# Patient Record
Sex: Male | Born: 1970 | Race: White | Hispanic: No | Marital: Married | Smoking: Former smoker
Health system: Southern US, Community
[De-identification: ages and names within clinical notes are randomized; demographics above are authoritative.]

## PROBLEM LIST (undated history)

## (undated) DIAGNOSIS — B009 Herpesviral infection, unspecified: Secondary | ICD-10-CM

## (undated) DIAGNOSIS — G5600 Carpal tunnel syndrome, unspecified upper limb: Secondary | ICD-10-CM

## (undated) DIAGNOSIS — T7840XA Allergy, unspecified, initial encounter: Secondary | ICD-10-CM

## (undated) HISTORY — DX: Carpal tunnel syndrome, unspecified upper limb: G56.00

## (undated) HISTORY — DX: Herpesviral infection, unspecified: B00.9

## (undated) HISTORY — DX: Allergy, unspecified, initial encounter: T78.40XA

## (undated) HISTORY — PX: WISDOM TOOTH EXTRACTION: SHX21

---

## 2015-11-05 ENCOUNTER — Encounter: Payer: Self-pay | Admitting: Family Medicine

## 2015-11-05 ENCOUNTER — Ambulatory Visit (INDEPENDENT_AMBULATORY_CARE_PROVIDER_SITE_OTHER): Payer: Managed Care, Other (non HMO) | Admitting: Family Medicine

## 2015-11-05 VITALS — BP 126/85 | HR 58 | Temp 98.3°F | Resp 16 | Ht 73.0 in | Wt 150.8 lb

## 2015-11-05 DIAGNOSIS — A6000 Herpesviral infection of urogenital system, unspecified: Secondary | ICD-10-CM | POA: Insufficient documentation

## 2015-11-05 DIAGNOSIS — G5601 Carpal tunnel syndrome, right upper limb: Secondary | ICD-10-CM | POA: Diagnosis not present

## 2015-11-05 DIAGNOSIS — Z87891 Personal history of nicotine dependence: Secondary | ICD-10-CM | POA: Diagnosis not present

## 2015-11-05 DIAGNOSIS — R200 Anesthesia of skin: Secondary | ICD-10-CM | POA: Insufficient documentation

## 2015-11-05 DIAGNOSIS — A6009 Herpesviral infection of other urogenital tract: Secondary | ICD-10-CM

## 2015-11-05 DIAGNOSIS — Z7689 Persons encountering health services in other specified circumstances: Secondary | ICD-10-CM | POA: Diagnosis not present

## 2015-11-05 DIAGNOSIS — Z23 Encounter for immunization: Secondary | ICD-10-CM

## 2015-11-05 DIAGNOSIS — Z8659 Personal history of other mental and behavioral disorders: Secondary | ICD-10-CM | POA: Diagnosis not present

## 2015-11-05 DIAGNOSIS — J3089 Other allergic rhinitis: Secondary | ICD-10-CM

## 2015-11-05 DIAGNOSIS — G5621 Lesion of ulnar nerve, right upper limb: Secondary | ICD-10-CM

## 2015-11-05 DIAGNOSIS — G5600 Carpal tunnel syndrome, unspecified upper limb: Secondary | ICD-10-CM | POA: Insufficient documentation

## 2015-11-05 MED ORDER — NAPROXEN 500 MG PO TABS: 500.0000 mg | ORAL_TABLET | Freq: Two times a day (BID) | ORAL | 1 refills | Status: DC

## 2015-11-05 MED ORDER — VALACYCLOVIR HCL 500 MG PO TABS: 500.0000 mg | ORAL_TABLET | Freq: Two times a day (BID) | ORAL | 0 refills | Status: DC | PRN

## 2015-11-05 NOTE — Assessment & Plan Note (Signed)
Stable, controlled on OTC Loratadine

## 2015-11-05 NOTE — Assessment & Plan Note (Addendum)
Remains smoke free >2 years now, less craving with reduced stress at new job - Encouraged remain smoke free - Doesn't meet future criteria for lung CA screening < 30 pack year smoker

## 2015-11-05 NOTE — Progress Notes (Signed)
Subjective:    Patient ID: Andre Robbins, male    DOB: [birth year], 45 y.o.   MRN: [id number]  Andre Robbins is a 45 y.o. male presenting on 11/05/2015 for Establish Care (main concerned carpal tunnel)  Previously PCP in [place], moved to [facility] in June 2017 for job with [employer]  HPI   RIGHT UPPER EXTREMITY NUMBNESS / PARESTHESIAS: - Reports last 4 years with gradual worsening R-sided forearm numbness and paresthesia pain, with mild involvement of 4th and 5th digits in R hand only. He suspected this was due to Carpal Tunnel with overuse due to variety of factors including work. No significant prior injury, work-up or treatment. He managed symptoms on his own with wrist splint/brace, activity modification, as needed NSAIDs with ibuprofen. - Recent injury 2 weeks ago while playing on monkey bars with his daughter, he was hanging by his right arm and seems to have strained his muscles and aggravated his nerve, causing worsening numbness, tingling, paresthesias in his R forearm, with stiffness in R elbow - Gradual improvement, treated with Prednisone taper at urgent care about 1.5 weeks ago, he has modified work, now using mouse in left hand, using stamp, but still signs many documents daily - Previously did not have hand or grip weakness, or finger numbness/tingling, usually flare up with 1-2 days, resolved with brace and ibuprofen / naproxen - Requests referral for further evaluation  Seasonal Allergies - Reports chronic mild seasonal allergies, when living in [place], controlled with Loratadine 10mg  daily during seasons as needed  H/o Depression - Previously worked as Insurance account manager at [employer], and he was not happy with that job and his family life, limited time for family, this year he has switched jobs, now moved to [facility]. Prior treatments included therapy but no medical treatment. - No concerns today - Fam history of depression both parents  Genital Herpes: - Reports chronic problem for many  years, significantly improved over years with fewer outbreaks - Previously had taken Valtrex with as needed, has been off for while, never on suppression therapy - Request rx for Valtrex as needed flares, 1-2 x yearly  Lifestyle: - No regular diet - Regular exercise currently with cycling (was going to work on bicycle about 1 mile daily), 1-2x weekly longer rides up to 10 miles  [employer] - Prior history of smoking 25 years, < 1ppd, tapered down over last several years to quit, treated with Wellbutrin with good results, also improved recently with less craving now with career change and less stress  Health Maintenance: - No family history of prostate CA. Has had prior DRE reportedly slightly enlarged prostate within past 5 years, no prior PSA.  - Family history of Colon CA (PGF age 38), will wait until age 25 for screening, interested in cologuard   Past Medical History:  Diagnosis Date  . Allergy   . Carpal tunnel syndrome   . Depression    in past  . Herpes    genital   Social History   Social History  . Marital status: Married    Spouse name: N/A  . Number of children: 1  . Years of education: PhD (Biology)   Occupational History  . [employer] - Production designer, theatre/television/film    Social History Main Topics  . Smoking status: Former Smoker    Packs/day: 0.50    Years: 25.00    Types: Cigarettes    Quit date: 10/06/2013  . Smokeless tobacco: Former Neurosurgeon  . Alcohol use 3.6 oz/week  6 Glasses of wine per week  . Drug use: No  . Sexual activity: Not on file   Other Topics Concern  . Not on file   Social History Narrative  . No narrative on file   Family History  Problem Relation Age of Onset  . Kidney nephrosis Mother   . Depression Mother   . Depression Father   . Colon cancer Paternal Grandfather 54   No current outpatient prescriptions on file prior to visit.   No current facility-administered medications on file prior to visit.     Review of Systems Per  HPI unless specifically indicated above     Objective:    BP 126/85   Pulse (!) 58   Temp 98.3 F (36.8 C) (Oral)   Resp 16   Ht 6\' 1"  (1.854 m)   Wt 150 lb 12.8 oz (68.4 kg)   BMI 19.90 kg/m   Wt Readings from Last 3 Encounters:  11/05/15 150 lb 12.8 oz (68.4 kg)    Physical Exam  Constitutional: He is oriented to person, place, and time. He appears well-developed and well-nourished. No distress.  Well-appearing, comfortable, cooperative  HENT:  Head: Normocephalic and atraumatic.  Mouth/Throat: Oropharynx is clear and moist.  Neck: Normal range of motion. Neck supple. No thyromegaly present.  Cardiovascular: Normal rate, regular rhythm, normal heart sounds and intact distal pulses.   No murmur heard. Pulmonary/Chest: Effort normal and breath sounds normal. No respiratory distress. He has no wheezes. He has no rales.  Musculoskeletal: He exhibits no edema or tenderness.  Right Upper Extremity Inspection: Normal appearance, has wrist brace, removed for exam Palpation: See below mild reduced sensation R dorsal forearm and 5th digit. Non-tender over R elbow or wrist extensors, wrist, and hand / digits ROM: Significant limited ROM with R elbow extension, prefers to stay flexed, can gradually actively extend fully. Special Testing: Tinel's over ulnar nerve in elbow mild positive, R median nerve carpal tunnel tinel's minimal positive with only some sensation into R middle finger without significant paresthesias Strength: 5/5 grip bilaterally, wrist flex/ext   Lymphadenopathy:    He has no cervical adenopathy.  Neurological: He is alert and oriented to person, place, and time. He has normal reflexes. Coordination normal.  Reduced sensation to light touch Right dorsal forearm closer to wrist, very mild reduced sensation in R 5th finger mostly dorsal.  Skin: Skin is warm and dry. No rash noted. He is not diaphoretic.  Psychiatric: He has a normal mood and affect. His behavior is  normal.  Nursing note and vitals reviewed.   No results found for this or any previous visit.    Assessment & Plan:   Problem List Items Addressed This Visit    History of depression    Resolved. Secondary to life stressors with prior job and limited family time, not related to all aspects of his life. - Treated with counseling only, no medical therapy - PHQ2: 0 today  Plan: 1. 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 Referral to [facility] for peripheral nerve evaluation and Nerve Conduction Study (NCS), follow-up results and recs and anticipate may need surgical referral given chronicity of problem 2. Rx Naproxen 500mg  BID x 2-4 weeks then PRN, prior improved on steroid, now continue NSAID 3. Continue conservative therapy with wrist splint and activity modification, also recommend forearm strap      Relevant Medications   naproxen (NAPROSYN) 500 MG tablet   Other Relevant Orders   Ambulatory referral to Neurology   Carpal tunnel syndrome    Possible R carpal tunnel  syndrome in past, however currently seems less likely to be affecting median nerve, given location of nerve symptoms - See Ulnar Nerve entrapment A&P - Referral to [facility] for eval and NCS, f/u      Relevant Medications   naproxen (NAPROSYN) 500 MG tablet   Other Relevant Orders   Ambulatory referral to Neurology    Other Visit Diagnoses    Encounter to establish care with new doctor    -  Primary   Need for Tdap vaccination       Relevant Orders   Tdap vaccine greater than or equal to 7yo IM (Completed)      Meds ordered this encounter  Medications  . naproxen (NAPROSYN) 500 MG tablet    Sig: Take 1 tablet (500 mg total) by mouth 2 (two) times daily with a meal. For 2 to 4 weeks then as needed.    Dispense:  60 tablet    Refill:  1  . valACYclovir (VALTREX) 500 MG tablet    Sig: Take 1 tablet (500 mg total) by mouth 2 (two) times daily as needed. For 3 days per HSV flare as needed.    Dispense:  30 tablet    Refill:  0      Follow up plan: Return in about 2 months (around 01/05/2016) for Annual physical.  Saralyn Pilar, DO [facility] 11/05/2015, 10:25 PM

## 2015-11-05 NOTE — Assessment & Plan Note (Addendum)
Suspected ulnar nerve entrapment as likely etiology for most or some of R UE nerve symptoms, with paresthesias and localized dorsal forearm numbness, likely overuse injury with some component of R epicondylitis in past - Recent improved on prednisone - Not entirely consistent with only carpal tunnel syndrome  Plan: 1. Referral [facility] for peripheral nerve evaluation and Nerve Conduction Study (NCS), follow-up results and recs and anticipate may need surgical referral given chronicity of problem 2. Rx Naproxen 500mg  BID x 2-4 weeks then PRN, prior improved on steroid, now continue NSAID 3. Continue conservative therapy with wrist splint and activity modification, also recommend forearm strap

## 2015-11-05 NOTE — Patient Instructions (Signed)
Thank you for coming in to clinic today.  1. For your Right Arm, I think you have a elbow or forearm Ulnar Nerve Entrapment, possibly no carpal tunnel, however this should all be evaluated by a Nerve Conduction Study, this will be arranged by your specialist, anticipate Orthopedics, will notify you with further information and an appointment as soon as we can. This may take a few days to weeks. - For now Recommend trial of Anti-inflammatory with Naproxen (Naprosyn) 500mg  tabs - take one with food and plenty of water TWICE daily every day (breakfast and dinner), for next 2 to 4 weeks, then you may take only as needed - DO NOT TAKE any ibuprofen, aleve, motrin while you are taking this medicine - It is safe to take Tylenol Ext Str 500mg  tabs - take 1 to 2 (max dose 1000mg ) every 6 hours as needed for breakthrough pain, max 24 hour daily dose is 6 to 8 tablets or 4000mg   Keep wearing wrist split as well if this is helping  Can try one of the forearm straps for tennis elbow, these are available OTC as well. And may provide some relief, probably better in future to prevent worsening, less likely to help acutely now.  Take valtrex 1 pill twice a day for 3 days at first signs of a flare, max duration of 5 days if needed.  TDap today, good for 10 years  Next visit we will plan on Annual Physical in December 2018, we will await outside lab records, and order labs at that visit  Please schedule a follow-up appointment with Dr. Althea CharonKaramalegos in 2 months December 2018 for Annual Physical and Labs  If you have any other questions or concerns, please feel free to call the clinic or send a message through MyChart. You may also schedule an earlier appointment if necessary.  Saralyn PilarAlexander Karamalegos, DO [facility]

## 2015-11-05 NOTE — Assessment & Plan Note (Signed)
Possible R carpal tunnel syndrome in past, however currently seems less likely to be affecting median nerve, given location of nerve symptoms - See Ulnar Nerve entrapment A&P - [facility] for eval and NCS, f/u

## 2015-11-05 NOTE — Assessment & Plan Note (Signed)
Stable, chronic 1-2 flares yearly Start rx Valtrex 500mg  BID PRN x 3 days with flares, rx given, follow-up progress, may consider suppressive therapy in future if needed

## 2015-11-05 NOTE — Assessment & Plan Note (Signed)
Resolved. Secondary to life stressors with prior job and limited family time, not related to all aspects of his life. - Treated with counseling only, no medical therapy - PHQ2: 0 today  Plan: 1. Routine follow-up as needed

## 2016-07-04 LAB — HEMOGLOBIN A1C: HEMOGLOBIN A1C: 5.5

## 2016-07-04 LAB — LIPID PANEL
Cholesterol: 195 (ref 0–200)
HDL: 78 — AB (ref 35–70)
LDL CALC: 96
Triglycerides: 103 (ref 40–160)

## 2016-07-04 LAB — BASIC METABOLIC PANEL
Creatinine: 1.2 (ref 0.6–1.3)
Glucose: 82

## 2016-11-19 ENCOUNTER — Ambulatory Visit (INDEPENDENT_AMBULATORY_CARE_PROVIDER_SITE_OTHER): Payer: Managed Care, Other (non HMO) | Admitting: Family Medicine

## 2016-11-19 ENCOUNTER — Encounter: Payer: Self-pay | Admitting: Family Medicine

## 2016-11-19 VITALS — BP 116/69 | HR 62 | Temp 98.1°F | Resp 16 | Ht 73.0 in | Wt 152.0 lb

## 2016-11-19 DIAGNOSIS — A6009 Herpesviral infection of other urogenital tract: Secondary | ICD-10-CM | POA: Diagnosis not present

## 2016-11-19 DIAGNOSIS — R202 Paresthesia of skin: Secondary | ICD-10-CM | POA: Diagnosis not present

## 2016-11-19 DIAGNOSIS — J3089 Other allergic rhinitis: Secondary | ICD-10-CM

## 2016-11-19 DIAGNOSIS — Z114 Encounter for screening for human immunodeficiency virus [HIV]: Secondary | ICD-10-CM | POA: Diagnosis not present

## 2016-11-19 DIAGNOSIS — R200 Anesthesia of skin: Secondary | ICD-10-CM

## 2016-11-19 DIAGNOSIS — Z0000 Encounter for general adult medical examination without abnormal findings: Secondary | ICD-10-CM

## 2016-11-19 MED ORDER — VALACYCLOVIR HCL 500 MG PO TABS: 500.0000 mg | ORAL_TABLET | Freq: Two times a day (BID) | ORAL | 0 refills | Status: DC | PRN

## 2016-11-19 NOTE — Patient Instructions (Addendum)
Thank you for coming to the clinic today.  1.  Keep up the good work!  I agree about nerve, expect slow gradual improvement.  No new treatments needed   3.  DUE for FASTING BLOOD WORK (no food or drink after midnight before the lab appointment, only water or coffee without cream/sugar on the morning of)  [facility] for lab draw in 1 WEEK approximately  For Lab Results, once available within 2-3 days of blood draw, you can can log in to MyChart online to view your results and a brief explanation. Also, we can discuss results at next follow-up visit.  Please schedule a Follow-up Appointment to: Return in about 1 year (around 11/19/2017) for Annual Physical.  If you have any other questions or concerns, please feel free to call the clinic or send a message through MyChart. You may also schedule an earlier appointment if necessary.  Additionally, you may be receiving a survey about your experience at our clinic within a few days to 1 week by e-mail or mail. We value your feedback.  Saralyn PilarAlexander Karamalegos, DO [facility]

## 2016-11-19 NOTE — Progress Notes (Addendum)
Subjective:    Patient ID: Andre Robbins, male    DOB: 31-Oct-1970, 46 y.o.   MRN: [id number]  Andre Robbins is a 46 y.o. male presenting on 11/19/2016 for Annual Exam  HPI   Here for Annual Physical Exam and due for Lab Orders. - He is a Paramedic, and he had biometric labs done in Summer 2018. Unsure results, states they were "all normal", did not return here for lab orders prior to visit today  Lifestyle: - Weight stable +/- 1-2 lbs in year - Tries to eat healthy and balanced diet - Exercise with biking  FOLLOW-UP R-Forearm/UE NUMBNESS - Last visit with me 11/05/15, for initial visit for same chronic problem with gradual worse, R hand only, thought Carpal Tunnel , \treated with referral to [facility] for nerve conduction test and eval, see prior notes for background information. - Interval update with saw Dr Dorene Grebe on 11/8 for NCS results were within range of normal. Follow-up on 11/29 without clear etiology, ultimately dx with nerve compression injury, gradual improvement, no further testing or labs required, considered other imaging spine/brain declined. Considered Orthopedics referral. - Today patient reports he has done well in past year, some gradual improvement with the numbness, but still present, seems to be smaller area and less severe, it is not affecting his daily function. He has switched to Left handed mouse work and writing with R hand. New job able to do this. He is also wearing a brace for carpal tunnel to avoid strain on R hand/wrist. - Takes Advil rarely PRN - Denies weakness, pain, swelling, redness  Seasonal/Environmental Allergies -Continues on Loratadine  History of Genital Herpes - Reports 1-3 x flares a year, took Valtrex PRN with good results, request refill did not finish last bottle  Right Knee Pain, intermittent - Reports old prior R knee injury, age 63s, occasional problem now with bike riding >20 miles or long strenuous activity, only gets  mild flare and ache, without swelling or other concerns. He has never been dx with arthritis. No knee x-rays. Rare advil PRN  Health Maintenance: - Due for routine HIV screening - UTD Flu Shot 11/03/16  Depression screen PHQ 2/9 11/19/2016 11/05/2015  Decreased Interest 0 0  Down, Depressed, Hopeless 0 0  PHQ - 2 Score 0 0    Past Medical History:  Diagnosis Date  . Allergy   . Carpal tunnel syndrome   . Depression    in past  . Herpes    genital   History reviewed. No pertinent surgical history. Social History   Socioeconomic History  . Marital status: Married    Spouse name: Not on file  . Number of children: 1  . Years of education: PhD (Biology)  . Highest education level: Not on file  Social Needs  . Financial resource strain: Not on file  . Food insecurity - worry: Not on file  . Food insecurity - inability: Not on file  . Transportation needs - medical: Not on file  . Transportation needs - non-medical: Not on file  Occupational History  . Occupation: Chief Operating Officer - Production designer, theatre/television/film  Tobacco Use  . Smoking status: Former Smoker    Packs/day: 0.50    Years: 25.00    Pack years: 12.50    Types: Cigarettes    Last attempt to quit: 10/06/2013    Years since quitting: 3.1  . Smokeless tobacco: Former Engineer, water and Sexual Activity  . Alcohol use: Yes    Alcohol/week:  3.6 oz    Types: 6 Glasses of wine per week  . Drug use: No  . Sexual activity: Not on file  Other Topics Concern  . Not on file  Social History Narrative  . Not on file   Family History  Problem Relation Age of Onset  . Kidney nephrosis Mother   . Depression Mother   . Depression Father   . Colon cancer Paternal Grandfather 67  . Prostate cancer Neg Hx    No current outpatient medications on file prior to visit.   No current facility-administered medications on file prior to visit.     Review of Systems  Constitutional: Negative for activity change, appetite change, chills,  diaphoresis, fatigue, fever and unexpected weight change.  HENT: Negative for congestion, hearing loss and sinus pressure.   Eyes: Negative for visual disturbance.  Respiratory: Negative for apnea, cough, choking, chest tightness, shortness of breath and wheezing.   Cardiovascular: Negative for chest pain, palpitations and leg swelling.  Gastrointestinal: Negative for abdominal pain, anal bleeding, blood in stool, constipation, diarrhea, nausea and vomiting.  Endocrine: Negative for cold intolerance and polyuria.  Genitourinary: Negative for difficulty urinating, dysuria, frequency and hematuria.  Musculoskeletal: Negative for arthralgias and neck pain.  Skin: Negative for rash.  Allergic/Immunologic: Positive for environmental allergies.  Neurological: Positive for numbness (Right forearm dorsal only, chronic gradual improve). Negative for dizziness, tremors, weakness, light-headedness and headaches.  Hematological: Negative for adenopathy.  Psychiatric/Behavioral: Negative for behavioral problems, dysphoric mood and sleep disturbance. The patient is not nervous/anxious.    Per HPI unless specifically indicated above     Objective:    BP 116/69   Pulse 62   Temp 98.1 F (36.7 C) (Oral)   Resp 16   Ht 6\' 1"  (1.854 m)   Wt 152 lb (68.9 kg)   BMI 20.05 kg/m   Wt Readings from Last 3 Encounters:  11/19/16 152 lb (68.9 kg)  11/05/15 150 lb 12.8 oz (68.4 kg)    Physical Exam  Constitutional: He is oriented to person, place, and time. He appears well-developed and well-nourished. No distress.  Well-appearing, comfortable, cooperative  HENT:  Head: Normocephalic and atraumatic.  Mouth/Throat: Oropharynx is clear and moist.  Nares patent without purulence or edema. Bilateral TMs clear without erythema, effusion or bulging. Oropharynx clear without erythema, exudates, edema or asymmetry.  Eyes: Conjunctivae and EOM are normal. Pupils are equal, round, and reactive to light. Right eye  exhibits no discharge. Left eye exhibits no discharge.  Neck: Normal range of motion. Neck supple. No thyromegaly present.  No carotid bruits  Cardiovascular: Normal rate, regular rhythm, normal heart sounds and intact distal pulses.  No murmur heard. Pulmonary/Chest: Effort normal and breath sounds normal. No respiratory distress. He has no wheezes. He has no rales.  Good air movement  Abdominal: Soft. Bowel sounds are normal. He exhibits no distension and no mass. There is no tenderness.  Musculoskeletal: Normal range of motion. He exhibits no edema or tenderness.  Upper / Lower Extremities: - Normal muscle tone, strength bilateral upper extremities 5/5, lower extremities 5/5  Bilateral knees, normal appear, non bulky, no crepitus, full AROM, non tender  Right Hand/Wrist Inspection: Normal appearance, symmetrical, no bulky MCP joints, no edema or erythema. Palpation: Non tender hand / wrist, carpal bones, including MCP, base of thumb. No distinct anatomical snuff box or scaphoid tenderness.  ROM: full active wrist ROM flex / ext, ulnar / radial deviation Strength: 5/5 grip, thumb opposition, wrist flex/ext Neurovascular:  distally intact  Lymphadenopathy:       Head (right side): No submandibular adenopathy present.       Head (left side): No submandibular adenopathy present.    He has no cervical adenopathy.    He has no axillary adenopathy.       Right: No supraclavicular adenopathy present.       Left: No supraclavicular adenopathy present.  Neurological: He is alert and oriented to person, place, and time.  Distal sensation intact to light touch all extremities - except R forearm dorsal not affecting wrist or proximal forearm/elbow. Mildly reduced sensation to light touch, improved from previous 1 yr  Skin: Skin is warm and dry. No rash noted. He is not diaphoretic. No erythema.  Left forearm dorsal 1 x 1 cm firm but mobile round mass non tender, no surrounding abnormality, no  erythema  Psychiatric: He has a normal mood and affect. His behavior is normal.  Well groomed, good eye contact, normal speech and thoughts  Nursing note and vitals reviewed.      No results found for this or any previous visit.    Assessment & Plan:   Problem List Items Addressed This Visit    Environmental and seasonal allergies    Stable controlled Continue 2nd gen anti-histamine      Genital herpes    Stable, chronic 1-3 flares yearly Refill Valtrex 500mg  BID PRN x 3 days with flares, rx given, follow-up progress, may consider suppressive therapy in future if needed      Relevant Medications   valACYclovir (VALTREX) 500 MG tablet   Right arm numbness    Gradually improving R localized dorsal forearm numbness Chronic >4 years S/p NCS 11/2015 and [facility] consult - no clear dx, normal NCS, thought nerve compression, no further treatment/work-up  Plan: 1. Recommended continue avoid overuse R arm, continue splint, keep using L hand mouse 2. Follow-up if worsening - reconsider Carpal Tunnel etiology - meds, ortho, inj       Other Visit Diagnoses    Annual physical exam    -  Primary   Due for annual labs, placed [facility] orders, given to patient, if reassuring, check yearly, reviewed future colon/prostate CA screening guidelines   Relevant Orders   Comprehensive metabolic panel   Lipid panel   Hemoglobin A1c   CBC with Differential/Platelet   Screening for HIV (human immunodeficiency virus)       Relevant Orders   HIV antibody      Meds ordered this encounter  Medications  . valACYclovir (VALTREX) 500 MG tablet    Sig: Take 1 tablet (500 mg total) 2 (two) times daily as needed by mouth. For 3 days per HSV flare as needed.    Dispense:  30 tablet    Refill:  0    Follow up plan: Return in about 1 year (around 11/19/2017) for Annual Physical.  Printed [facility] orders given today. Review results 1 week. Next year notify for [facility] order release few weeks  before.  Saralyn Pilar, DO [facility] 11/19/2016, 8:53 PM

## 2016-11-19 NOTE — Assessment & Plan Note (Signed)
Gradually improving R localized dorsal forearm numbness Chronic >4 years S/p NCS 11/[facility] - no clear dx, normal NCS, thought nerve compression, no further treatment/work-up  Plan: 1. Recommended continue avoid overuse R arm, continue splint, keep using L hand mouse 2. Follow-up if worsening - reconsider Carpal Tunnel etiology - meds, ortho, inj

## 2016-11-19 NOTE — Assessment & Plan Note (Signed)
Stable controlled Continue 2nd gen anti-histamine

## 2016-11-19 NOTE — Assessment & Plan Note (Signed)
Stable, chronic 1-3 flares yearly Refill Valtrex 500mg  BID PRN x 3 days with flares, rx given, follow-up progress, may consider suppressive therapy in future if needed

## 2016-11-27 ENCOUNTER — Other Ambulatory Visit: Payer: Self-pay | Admitting: Family Medicine

## 2016-11-27 DIAGNOSIS — R7989 Other specified abnormal findings of blood chemistry: Secondary | ICD-10-CM

## 2016-11-27 DIAGNOSIS — R7309 Other abnormal glucose: Secondary | ICD-10-CM

## 2016-11-27 DIAGNOSIS — Z0000 Encounter for general adult medical examination without abnormal findings: Secondary | ICD-10-CM

## 2016-11-27 LAB — COMPREHENSIVE METABOLIC PANEL
A/G RATIO: 2.3 — AB (ref 1.2–2.2)
ALT: 7 IU/L (ref 0–44)
AST: 15 IU/L (ref 0–40)
Albumin: 4.4 g/dL (ref 3.5–5.5)
Alkaline Phosphatase: 70 IU/L (ref 39–117)
BILIRUBIN TOTAL: 0.8 mg/dL (ref 0.0–1.2)
BUN / CREAT RATIO: 11 (ref 9–20)
BUN: 13 mg/dL (ref 6–24)
CHLORIDE: 102 mmol/L (ref 96–106)
CO2: 25 mmol/L (ref 20–29)
Calcium: 9.4 mg/dL (ref 8.7–10.2)
Creatinine, Ser: 1.19 mg/dL (ref 0.76–1.27)
GFR calc Af Amer: 84 mL/min/{1.73_m2} (ref >59)
GFR calc non Af Amer: 73 mL/min/{1.73_m2} (ref >59)
GLOBULIN, TOTAL: 1.9 g/dL (ref 1.5–4.5)
Glucose: 94 mg/dL (ref 65–99)
Potassium: 4.6 mmol/L (ref 3.5–5.2)
SODIUM: 140 mmol/L (ref 134–144)
Total Protein: 6.3 g/dL (ref 6.0–8.5)

## 2016-11-27 LAB — CBC WITH DIFFERENTIAL/PLATELET
BASOS ABS: 0 10*3/uL (ref 0.0–0.2)
BASOS: 1 %
EOS (ABSOLUTE): 0.1 10*3/uL (ref 0.0–0.4)
EOS: 2 %
HEMATOCRIT: 44.5 % (ref 37.5–51.0)
HEMOGLOBIN: 15.5 g/dL (ref 13.0–17.7)
Immature Grans (Abs): 0 10*3/uL (ref 0.0–0.1)
Immature Granulocytes: 0 %
LYMPHS ABS: 2.3 10*3/uL (ref 0.7–3.1)
Lymphs: 42 %
MCH: 32.3 pg (ref 26.6–33.0)
MCHC: 34.8 g/dL (ref 31.5–35.7)
MCV: 93 fL (ref 79–97)
MONOCYTES: 9 %
Monocytes Absolute: 0.5 10*3/uL (ref 0.1–0.9)
NEUTROS ABS: 2.5 10*3/uL (ref 1.4–7.0)
Neutrophils: 46 %
Platelets: 211 10*3/uL (ref 150–379)
RBC: 4.8 x10E6/uL (ref 4.14–5.80)
RDW: 12.9 % (ref 12.3–15.4)
WBC: 5.4 10*3/uL (ref 3.4–10.8)

## 2016-11-27 LAB — LIPID PANEL
CHOL/HDL RATIO: 2.8 ratio (ref 0.0–5.0)
CHOLESTEROL TOTAL: 198 mg/dL (ref 100–199)
HDL: 70 mg/dL (ref >39)
LDL CALC: 113 mg/dL — AB (ref 0–99)
TRIGLYCERIDES: 77 mg/dL (ref 0–149)
VLDL CHOLESTEROL CAL: 15 mg/dL (ref 5–40)

## 2016-11-27 LAB — HIV ANTIBODY (ROUTINE TESTING W REFLEX): HIV Screen 4th Generation wRfx: NONREACTIVE

## 2016-11-27 LAB — HEMOGLOBIN A1C
Est. average glucose Bld gHb Est-mCnc: 114 mg/dL
Hgb A1c MFr Bld: 5.6 % (ref 4.8–5.6)

## 2016-11-29 ENCOUNTER — Encounter: Payer: Self-pay | Admitting: Family Medicine

## 2017-01-10 ENCOUNTER — Emergency Department
Admission: EM | Admit: 2017-01-10 | Discharge: 2017-01-10 | Disposition: A | Discharge status: Home / Self Care | Payer: Managed Care, Other (non HMO) | Attending: Student in an Organized Health Care Education/Training Program | Admitting: Student in an Organized Health Care Education/Training Program

## 2017-01-10 ENCOUNTER — Emergency Department: Payer: Managed Care, Other (non HMO)

## 2017-01-10 DIAGNOSIS — Y92017 Garden or yard in single-family (private) house as the place of occurrence of the external cause: Secondary | ICD-10-CM | POA: Diagnosis not present

## 2017-01-10 DIAGNOSIS — Z87891 Personal history of nicotine dependence: Secondary | ICD-10-CM | POA: Diagnosis not present

## 2017-01-10 DIAGNOSIS — S91331A Puncture wound without foreign body, right foot, initial encounter: Secondary | ICD-10-CM | POA: Diagnosis not present

## 2017-01-10 DIAGNOSIS — W268XXA Contact with other sharp object(s), not elsewhere classified, initial encounter: Secondary | ICD-10-CM | POA: Diagnosis not present

## 2017-01-10 DIAGNOSIS — Y9389 Activity, other specified: Secondary | ICD-10-CM | POA: Diagnosis not present

## 2017-01-10 DIAGNOSIS — F329 Major depressive disorder, single episode, unspecified: Secondary | ICD-10-CM | POA: Insufficient documentation

## 2017-01-10 DIAGNOSIS — Y998 Other external cause status: Secondary | ICD-10-CM | POA: Diagnosis not present

## 2017-01-10 DIAGNOSIS — S99921A Unspecified injury of right foot, initial encounter: Secondary | ICD-10-CM | POA: Diagnosis present

## 2017-01-10 LAB — BASIC METABOLIC PANEL
Anion gap: 8 (ref 5–15)
BUN: 19 mg/dL (ref 6–20)
CALCIUM: 8.9 mg/dL (ref 8.9–10.3)
CO2: 25 mmol/L (ref 22–32)
CREATININE: 1.18 mg/dL (ref 0.61–1.24)
Chloride: 107 mmol/L (ref 101–111)
GFR calc non Af Amer: >60 mL/min (ref >60)
Glucose, Bld: 92 mg/dL (ref 65–99)
Potassium: 3.6 mmol/L (ref 3.5–5.1)
SODIUM: 140 mmol/L (ref 135–145)

## 2017-01-10 LAB — CBC WITH DIFFERENTIAL/PLATELET
BASOS PCT: 1 %
Basophils Absolute: 0 10*3/uL (ref 0–0.1)
EOS ABS: 0 10*3/uL (ref 0–0.7)
EOS PCT: 1 %
HCT: 45.2 % (ref 40.0–52.0)
HEMOGLOBIN: 15.4 g/dL (ref 13.0–18.0)
Lymphocytes Relative: 20 %
Lymphs Abs: 2 10*3/uL (ref 1.0–3.6)
MCH: 31.1 pg (ref 26.0–34.0)
MCHC: 34.1 g/dL (ref 32.0–36.0)
MCV: 91.4 fL (ref 80.0–100.0)
Monocytes Absolute: 0.8 10*3/uL (ref 0.2–1.0)
Monocytes Relative: 8 %
NEUTROS PCT: 70 %
Neutro Abs: 7 10*3/uL — ABNORMAL HIGH (ref 1.4–6.5)
PLATELETS: 218 10*3/uL (ref 150–440)
RBC: 4.94 MIL/uL (ref 4.40–5.90)
RDW: 12.8 % (ref 11.5–14.5)
WBC: 9.9 10*3/uL (ref 3.8–10.6)

## 2017-01-10 MED ORDER — LEVOFLOXACIN 750 MG PO TABS: 750.0000 mg | ORAL_TABLET | Freq: Every day | ORAL | 0 refills | Status: AC

## 2017-01-10 MED ORDER — PIPERACILLIN-TAZOBACTAM 3.375 G IVPB 30 MIN
3.3750 g | Freq: Once | INTRAVENOUS | Status: AC
  Administered 2017-01-10: 3.375 g via INTRAVENOUS
  Filled 2017-01-10: qty 50

## 2017-01-10 MED ORDER — BACITRACIN ZINC 500 UNIT/GM EX OINT: TOPICAL_OINTMENT | Freq: Once | CUTANEOUS | Status: DC

## 2017-01-10 NOTE — Discharge Instructions (Addendum)
You are being treated for a plantar foot puncture wound. This type of wound is a serious injury that is prone to infection. Your labs and x-ray are normal at this time. Take the antibiotic as directed. Monitor the wound closely for signs of worsening infection. Use daily epsom salt soaks for wound care. Follow-up with your provider or return to the ED for signs of worsening infection.

## 2017-01-10 NOTE — ED Provider Notes (Signed)
[facility] Emergency Department Provider Note ____________________________________________  Time seen: 2055  I have reviewed the triage vital signs and the nursing notes.  HISTORY  Chief Complaint  Puncture Wound   HPI Andre Robbins is a 47 y.o. male presents to the ED accompanied by his family, for evaluation of pain to the plantar right foot.  Patient describes he was at home walking outside, when he excellently stepped on an exposed nail stuck in a piece of plywood.  He reports the nail piercing to the bottom of his work boot, but immediately lifting up his foot at the time of the prick.  He denies any active or spontaneous bleeding.  He continued to work following the accident for about 6 hours.  He then began to experience some increasing pain to the plantar surface of the foot.  He also describes an episode of noting low-grade fevers with his temporal thermometer.  He reports several readings ranging from 99-101 F.  He he reports some chills as well as some malaise.  He is unsure if those symptoms are related to his earlier puncture wound.  He presents now with pain, redness, tenderness to the plantar surface of foot as well as some vague redness noted to the dorsal aspect of the foot as well.  Past Medical History:  Diagnosis Date  . Allergy   . Carpal tunnel syndrome   . Depression    in past  . Herpes    genital    Patient Active Problem List   Diagnosis Date Noted  . Genital herpes 11/05/2015  . Carpal tunnel syndrome 11/05/2015  . Right arm numbness 11/05/2015  . Environmental and seasonal allergies 11/05/2015  . Former smoker 11/05/2015    History reviewed. No pertinent surgical history.  Prior to Admission medications   Medication Sig Start Date End Date Taking? Authorizing Provider  levofloxacin (LEVAQUIN) 750 MG tablet Take 1 tablet (750 mg total) by mouth daily for 7 days. 01/10/17 01/17/17  Edmon Magid, Charlesetta IvoryJenise V Bacon, PA-C  valACYclovir  (VALTREX) 500 MG tablet Take 1 tablet (500 mg total) 2 (two) times daily as needed by mouth. For 3 days per HSV flare as needed. 11/19/16   Smitty CordsKaramalegos, Alexander J, DO    Allergies Patient has no known allergies.  Family History  Problem Relation Age of Onset  . Kidney nephrosis Mother   . Depression Mother   . Depression Father   . Colon cancer Paternal Grandfather [id number]  . Prostate cancer Neg Hx     Social History Social History   Tobacco Use  . Smoking status: Former Smoker    Packs/day: 0.50    Years: 25.00    Pack years: 12.50    Types: Cigarettes    Last attempt to quit: 10/06/2013    Years since quitting: 3.2  . Smokeless tobacco: Former Engineer, waterUser  Substance Use Topics  . Alcohol use: Yes    Alcohol/week: 3.6 oz    Types: 6 Glasses of wine per week  . Drug use: No    Review of Systems  Constitutional: Positive for fever. Cardiovascular: Negative for chest pain. Respiratory: Negative for shortness of breath. Gastrointestinal: Negative for abdominal pain, vomiting and diarrhea. Genitourinary: Negative for dysuria. Musculoskeletal: Negative for back pain. Skin: Negative for rash.  Right foot plantar wound is noted. Neurological: Negative for headaches, focal weakness or numbness. ____________________________________________  PHYSICAL EXAM:  VITAL SIGNS: ED Triage Vitals  Enc Vitals Group     BP 01/10/17 1939 118/75  Pulse Rate 01/10/17 1939 87     Resp 01/10/17 1939 17     Temp 01/10/17 1939 98.8 F (37.1 C)     Temp Source 01/10/17 1939 Oral     SpO2 01/10/17 1939 97 %     Weight 01/10/17 1937 150 lb (68 kg)     Height 01/10/17 1937 6\' 1"  (1.854 m)     Head Circumference --      Peak Flow --      Pain Score 01/10/17 1937 6     Pain Loc --      Pain Edu? --      Excl. in GC? --     Constitutional: Alert and oriented. Well appearing and in no distress. Cardiovascular: Normal rate, regular rhythm. Normal distal pulses. Respiratory: Normal respiratory  effort. No wheezes/rales/rhonchi. Musculoskeletal: Nontender with normal range of motion in all extremities.  Neurologic:  Normal gait without ataxia. Normal speech and language. No gross focal neurologic deficits are appreciated. Skin:  Skin is warm, dry and intact. No rash noted.  Right foot with a small superficial plantar wound overlying the fourth digit MTP.  There is some local erythema noted no purulence or pustule appreciated.  There is also some corresponding erythema to the dorsal aspect of the foot.  No streaking or lymphangitis is appreciated at this time.  Patient is tender to palpation to the dorsal aspect of the foot over the fourth MTP and interspace. ____________________________________________   LABS (pertinent positives/negatives)  Labs Reviewed  CBC WITH DIFFERENTIAL/PLATELET - Abnormal; Notable for the following components:      Result Value   Neutro Abs 7.0 (*)    All other components within normal limits  BASIC METABOLIC PANEL  ___________________________________________   RADIOLOGY  Right Foot  IMPRESSION: No acute osseous abnormality. ____________________________________________  PROCEDURES  Procedures Zosyn 3.375 mg IVP ____________________________________________  INITIAL IMPRESSION / ASSESSMENT AND PLAN / ED COURSE  Patient with ED evaluation of a right foot plantar wound with local cellulitis.  Patient's exam is consistent with a superficial wound to the plantar surface of the foot.  His HPI was somewhat concerning given his sudden onset of fevers and malaise.  As such labs were drawn.  Baseline labs are reassuring at this time showing no acute leukocytosis.  Patient is given an IV dose of piperacillin-tazobactam in the ED.  He is discharged with a prescription for levofloxacin to dose daily for the next 7 days.  He will continue to monitor the wound for any signs of worsening or progressing infection.  Return precautions have been reviewed with the  patient's family.  Verbalized understanding will be discharged with wound care instructions. ____________________________________________  FINAL CLINICAL IMPRESSION(S) / ED DIAGNOSES  Final diagnoses:  Puncture wound of right foot, initial encounter      Lissa Hoard, PA-C 01/10/17 2308    Willy Eddy, MD 01/10/17 2316

## 2017-01-10 NOTE — ED Triage Notes (Signed)
Patient reports stepping on a nail right foot this afternoon.

## 2017-01-10 NOTE — ED Notes (Signed)
Pt says he stepped on a nail earlier today and has a puncture wound on the bottom of his right foot; thought he would be fine but now the area feels hot and his foot is throbbing

## 2017-01-10 NOTE — ED Notes (Signed)
Patient reports stepped on a nail earlier today and having pain and swelling to site.  Tetanus utd.  Area noted to sole of right foot red with swelling noted.

## 2017-01-13 ENCOUNTER — Inpatient Hospital Stay: Payer: Managed Care, Other (non HMO) | Admitting: Family Medicine

## 2017-01-14 ENCOUNTER — Encounter: Payer: Self-pay | Admitting: Family Medicine

## 2017-01-14 ENCOUNTER — Ambulatory Visit (INDEPENDENT_AMBULATORY_CARE_PROVIDER_SITE_OTHER): Payer: Managed Care, Other (non HMO) | Admitting: Family Medicine

## 2017-01-14 VITALS — BP 114/72 | HR 60 | Temp 98.2°F | Resp 16 | Ht 73.0 in | Wt 151.0 lb

## 2017-01-14 DIAGNOSIS — B351 Tinea unguium: Secondary | ICD-10-CM | POA: Diagnosis not present

## 2017-01-14 DIAGNOSIS — S91331A Puncture wound without foreign body, right foot, initial encounter: Secondary | ICD-10-CM | POA: Diagnosis not present

## 2017-01-14 MED ORDER — TERBINAFINE HCL 250 MG PO TABS: 250.0000 mg | ORAL_TABLET | Freq: Every day | ORAL | 1 refills | Status: DC

## 2017-01-14 NOTE — Progress Notes (Signed)
Subjective:    Patient ID: Andre Robbins, male    DOB: May 01, 1970, 47 y.o.   MRN: [id number]  Ommar Nottage is a 47 y.o. male presenting on 01/14/2017 for Hospitalization Follow-up (rightside foot pain improved stepped on nail)   HPI  ED FOLLOW-UP VISIT  Hospital/Location: [facility] Date of ED Visit: 01/10/17  Reason for Presenting to ED: Puncture Wound, R Foot  FOLLOW-UP - ED provider note and record have been reviewed - Patient presents today about 4 days after recent ED visit. Brief summary of recent course, patient had accidental puncture wound to R foot when stepped on nail stuck in piece of wood, pierced bottom of his work boot, did not have obvious bleeding or other injury, his TDap was up to date 2017, he kept working on it all day for several hours, no problem, then later that evening he developed significant pain and possible swelling, he felt "chilled" and possibly low grade fever, went to ED, testing in ED with X-ray R foot negative, labs with chemistry and CBC w/o leukocytosis, given IV Zosyn x 1 dose, then discharged on rx Levaquin 750mg  daily x 7 days. - Today reports overall has done well after discharge from ED. Symptoms of pain have dramatically improved, seems only some soreness if sitting still and resting, at times, more activity on his foot seemed to do better, now mostly pain free except if putting direct pressure on it - Mild swelling resolved. No redness or warmth, never drained pus - Denies any more fevers chills or rash, nausea vomiting diarrhea - New medications on discharge: Levaquin 750mg  daily x 7 days, last dose (01/16/17) - Changes to current meds on discharge: None  Additional complaint  Onychomycosis, R Foot 3rd toenail - He has prior history of toenail fungal infection, seems only localized one toenail, has been given rx in past only took for partial course, some improve but did not resolve. He would like to try again after finishes current antibiotics. Denies  redness swelling toenail pain   Depression screen [facility] 2/9 11/19/2016 11/05/2015  Decreased Interest 0 0  Down, Depressed, Hopeless 0 0  PHQ - 2 Score 0 0    Social History   Tobacco Use  . Smoking status: Former Smoker    Packs/day: 0.50    Years: 25.00    Pack years: 12.50    Types: Cigarettes    Last attempt to quit: 10/06/2013    Years since quitting: 3.2  . Smokeless tobacco: Former Engineer, water Use Topics  . Alcohol use: Yes    Alcohol/week: 3.6 oz    Types: 6 Glasses of wine per week  . Drug use: No    Review of Systems Per HPI unless specifically indicated above     Objective:    BP 114/72   Pulse 60   Temp 98.2 F (36.8 C) (Oral)   Resp 16   Ht 6\' 1"  (1.854 m)   Wt 151 lb (68.5 kg)   BMI 19.92 kg/m   Wt Readings from Last 3 Encounters:  01/14/17 151 lb (68.5 kg)  01/10/17 150 lb (68 kg)  11/19/16 152 lb (68.9 kg)    Physical Exam  Constitutional: He is oriented to person, place, and time. He appears well-developed and well-nourished. No distress.  Well-appearing, comfortable, cooperative  HENT:  Head: Normocephalic and atraumatic.  Mouth/Throat: Oropharynx is clear and moist.  Eyes: Conjunctivae are normal. Right eye exhibits no discharge. Left eye exhibits no discharge.  Neck: Normal range  of motion. Neck supple.  Cardiovascular: Normal rate and intact distal pulses.  No murmur heard. Pulmonary/Chest: Effort normal.  Musculoskeletal: He exhibits no edema.  Neurological: He is alert and oriented to person, place, and time.  Skin: Skin is warm and dry. No rash noted. He is not diaphoretic. No erythema.  Right foot plantar aspect with small puncture wound that is superficially healed over. No open wound. No ulceration. No erythema, only very minimal tender at that one point, no surrounding tender or abnormality, see picture  R foot 3rd toenail with thickening appearance consistent onychomycosis, other toenails not involved, no erythema, no edema    Nursing note and vitals reviewed.    Right Foot     Results for orders placed or performed during the hospital encounter of 01/10/17  Basic metabolic panel  Result Value Ref Range   Sodium 140 135 - 145 mmol/L   Potassium 3.6 3.5 - 5.1 mmol/L   Chloride 107 101 - 111 mmol/L   CO2 25 22 - 32 mmol/L   Glucose, Bld 92 65 - 99 mg/dL   BUN 19 6 - 20 mg/dL   Creatinine, Ser 7.82 0.61 - 1.24 mg/dL   Calcium 8.9 8.9 - 95.6 mg/dL   GFR calc non Af Amer >60 >60 mL/min   GFR calc Af Amer >60 >60 mL/min   Anion gap 8 5 - 15  CBC with Differential  Result Value Ref Range   WBC 9.9 3.8 - 10.6 K/uL   RBC 4.94 4.40 - 5.90 MIL/uL   Hemoglobin 15.4 13.0 - 18.0 g/dL   HCT 21.3 08.6 - 57.8 %   MCV 91.4 80.0 - 100.0 fL   MCH 31.1 26.0 - 34.0 pg   MCHC 34.1 32.0 - 36.0 g/dL   RDW 46.9 62.9 - 52.8 %   Platelets 218 150 - 440 K/uL   Neutrophils Relative % 70 %   Neutro Abs 7.0 (H) 1.4 - 6.5 K/uL   Lymphocytes Relative 20 %   Lymphs Abs 2.0 1.0 - 3.6 K/uL   Monocytes Relative 8 %   Monocytes Absolute 0.8 0.2 - 1.0 K/uL   Eosinophils Relative 1 %   Eosinophils Absolute 0.0 0 - 0.7 K/uL   Basophils Relative 1 %   Basophils Absolute 0.0 0 - 0.1 K/uL      Assessment & Plan:   Problem List Items Addressed This Visit    Onychomycosis of toenail Start Terbinafine 250mg  daily x 4 weeks, has existing 2 weeks rx at home, for total 6 weeks, advised if dramatically improved can stop, otherwise refill given for 4 more weeks may need max 12    Relevant Medications   terbinafine (LAMISIL) 250 MG tablet    Other Visit Diagnoses    Puncture wound of right foot, initial encounter    -  Primary Resolving puncture wound / possible local cellulitis. Now no sign of secondary infection UTD TDap 10/2015 S/p Zosyn IV x 1 dose 01/10/17 Advised to finish Levaquin 750mg  daily, last dose 01/16/17 Return criteria given       Meds ordered this encounter  Medications  . terbinafine (LAMISIL) 250 MG tablet     Sig: Take 1 tablet (250 mg total) by mouth daily. For 4 weeks, may take up to 6 weeks to see if improved    Dispense:  30 tablet    Refill:  1   I have reviewed the discharge medication list, and have reconciled the current and discharge medications today.  Follow up plan: Return if symptoms worsen or fail to improve, for follow-up as scheduled in 11/2017.  Saralyn Pilar, DO [facility] 01/14/2017, 2:32 PM

## 2017-01-14 NOTE — Patient Instructions (Addendum)
Thank you for coming to the office today.   1. Foot puncture wound looks good, no further care needed. FInish Levaquin antibiotic  2. For Right toenail fungal infection - onychomycosis - start Terbinafine 250mg  daily for 6 weeks (gave you bottle of 30 for 4 weeks, use your existing, and added 1 refill if needed),  Max is 12 weeks  Please schedule a Follow-up Appointment to: Return if symptoms worsen or fail to improve, for follow-up as scheduled in 11/2017.  If you have any other questions or concerns, please feel free to call the office or send a message through MyChart. You may also schedule an earlier appointment if necessary.  Additionally, you may be receiving a survey about your experience at our office within a few days to 1 week by e-mail or mail. We value your feedback.  Saralyn PilarAlexander Kristyne Woodring, DO [facility]

## 2017-07-01 LAB — LIPID PANEL
Cholesterol: 185 (ref 0–200)
HDL: 73 — AB (ref 35–70)
LDL Cholesterol: 99
Triglycerides: 63 (ref 40–160)

## 2017-07-01 LAB — MEASLES/MUMPS/RUBELLA IMMUNITY
RUBEOLA AB, IGG: 82.6
Rubella Antibodies, IGG: 3.26

## 2017-07-01 LAB — BASIC METABOLIC PANEL
CREATININE: 1.1 (ref 0.6–1.3)
GLUCOSE: 123

## 2017-07-01 LAB — HEMOGLOBIN A1C: Hgb A1c MFr Bld: 5.4 (ref 4.0–6.0)

## 2017-08-03 ENCOUNTER — Ambulatory Visit: Payer: Managed Care, Other (non HMO) | Admitting: Family Medicine

## 2017-08-03 ENCOUNTER — Ambulatory Visit
Admission: RE | Admit: 2017-08-03 | Discharge: 2017-08-03 | Disposition: A | Discharge status: Home / Self Care | Payer: Managed Care, Other (non HMO) | Source: Ambulatory Visit | Attending: Family Medicine | Admitting: Family Medicine

## 2017-08-03 ENCOUNTER — Encounter: Payer: Self-pay | Admitting: Family Medicine

## 2017-08-03 VITALS — BP 117/68 | HR 62 | Temp 98.4°F | Resp 16 | Ht 73.0 in | Wt 150.0 lb

## 2017-08-03 DIAGNOSIS — S91332A Puncture wound without foreign body, left foot, initial encounter: Secondary | ICD-10-CM

## 2017-08-03 DIAGNOSIS — X58XXXA Exposure to other specified factors, initial encounter: Secondary | ICD-10-CM | POA: Diagnosis not present

## 2017-08-03 MED ORDER — MUPIROCIN 2 % EX OINT: 1.0000 "application " | TOPICAL_OINTMENT | Freq: Two times a day (BID) | CUTANEOUS | 0 refills | Status: DC

## 2017-08-03 NOTE — Progress Notes (Signed)
Subjective:    Patient ID: Andre Robbins, male    DOB: 1970-12-11, 47 y.o.   MRN: [id number]  Andre Robbins is a 47 y.o. male presenting on 08/03/2017 for Foot Pain (Left foot )   HPI   Urgent Care FOLLOW-UP VISIT  Hospital/Location: [facility] Date of UC Visit: 07/18/17  Reason for Presenting to UC: Left Foot Injury, Splinter Removal  FOLLOW-UP Left Foot Pain / Splinter Injury - Patient presents today about 16 days after recent UC visit. 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 He was unable to remove it, next day some inc swelling and pain still retained. - He was seen at [facility]gent [facility] on 07/18/17, they treated him with small incision to remove splinter and they removed 1/2 inch splinter by report after local anesthesia and rinsed with lactated ringers, he has picture of it. Treated with Keflex 500mg  TID for 10 days, completed this course without significant change in course. - Now describes persistent symptoms with pain in Left foot, worse in morning with pain while walking on it, still with redness and swelling. Some improve with more activity on it. Overall he feels symptoms just not resolving. But does not seem to be worsening. - He took advil PRN with some relief. He is able to function and stand at work, but now bothered by constant symptoms  - New medications on discharge: Tramadol (has not taken this yet, concern with opioid), Keflex (finished course 10 days) - Changes to current meds on discharge: none  Admits some occasional twinges of pain some brief tingling Admits some residual swelling and local redness Denies  fevers chills other joint pain or swelling  Depression screen [facility] 2/9 08/03/2017 11/19/2016 11/05/2015  Decreased Interest 0 0 0  Down, Depressed, Hopeless 0 0 0  PHQ - 2 Score 0 0 0    Social History   Tobacco Use  . Smoking status: Former Smoker    Packs/day: 0.50    Years: 25.00    Pack years: 12.50    Types: Cigarettes    Last attempt to quit: 10/06/2013    Years since quitting: 3.8  . Smokeless tobacco: Former Engineer, water Use Topics  . Alcohol use: Yes    Alcohol/week: 3.6 oz    Types: 6 Glasses of wine per week  . Drug use: No    Review of Systems Per HPI unless specifically indicated above     Objective:    BP 117/68   Pulse 62   Temp 98.4 F (36.9 C) (Oral)   Resp 16   Ht 6\' 1"  (1.854 m)   Wt 150 lb (68 kg)   BMI 19.79 kg/m   Wt Readings from Last 3 Encounters:  08/03/17 150 lb (68 kg)  01/14/17 151 lb (68.5 kg)  01/10/17 150 lb (68 kg)    Physical Exam  Constitutional: He is oriented to person, place, and time. He appears well-developed and well-nourished. No distress.  Well-appearing, comfortable, cooperative  HENT:  Head: Normocephalic and atraumatic.  Mouth/Throat: Oropharynx is clear and moist.  Eyes: Conjunctivae are normal. Right eye exhibits no discharge. Left eye exhibits  no discharge.  Cardiovascular: Normal rate.  Pulmonary/Chest: Effort normal.  Musculoskeletal: He exhibits no edema.  Neurological: He is alert and oriented to person, place, and time.  Skin: Skin is warm and dry. No rash noted. He is not diaphoretic. No erythema.  Localized superficial dorsal foot puncture wound - see picture. Non tender, no palpable foreign body on exam, no drainage, no extending erythema  Psychiatric: He has a normal mood and affect. His behavior is normal.  Well groomed, good eye contact, normal speech and thoughts  Nursing note and vitals reviewed.    Left foot     Results for orders placed or performed during the hospital encounter of  01/10/17  Basic metabolic panel  Result Value Ref Range   Sodium 140 135 - 145 mmol/L   Potassium 3.6 3.5 - 5.1 mmol/L   Chloride 107 101 - 111 mmol/L   CO2 25 22 - 32 mmol/L   Glucose, Bld 92 65 - 99 mg/dL   BUN 19 6 - 20 mg/dL   Creatinine, Ser 7.82 0.61 - 1.24 mg/dL   Calcium 8.9 8.9 - 95.6 mg/dL   GFR calc non Af Amer >60 >60 mL/min   GFR calc Af Amer >60 >60 mL/min   Anion gap 8 5 - 15  CBC with Differential  Result Value Ref Range   WBC 9.9 3.8 - 10.6 K/uL   RBC 4.94 4.40 - 5.90 MIL/uL   Hemoglobin 15.4 13.0 - 18.0 g/dL   HCT 21.3 08.6 - 57.8 %   MCV 91.4 80.0 - 100.0 fL   MCH 31.1 26.0 - 34.0 pg   MCHC 34.1 32.0 - 36.0 g/dL   RDW 46.9 62.9 - 52.8 %   Platelets 218 150 - 440 K/uL   Neutrophils Relative % 70 %   Neutro Abs 7.0 (H) 1.4 - 6.5 K/uL   Lymphocytes Relative 20 %   Lymphs Abs 2.0 1.0 - 3.6 K/uL   Monocytes Relative 8 %   Monocytes Absolute 0.8 0.2 - 1.0 K/uL   Eosinophils Relative 1 %   Eosinophils Absolute 0.0 0 - 0.7 K/uL   Basophils Relative 1 %   Basophils Absolute 0.0 0 - 0.1 K/uL      Assessment & Plan:   Problem List Items Addressed This Visit    None    Visit Diagnoses    Puncture wound of left foot, initial encounter    -  Primary  Clinically left foot dorsal wound appears to be healing well, still with residual local erythema, tender and edema. However no actual evidence of secondary cellulitis or abscess. No significant infection, s/p keflex prophylaxis course at time of injury. No clear evidence of retained foreign body.  Plan Discussed options given persistent symptoms - most likely residual pain and sequela after injury - however will offer L foot x-ray to determine if any residual foreign body or injury still evident - however unlikely to see wood on x-ray - Recommend regular warm water epsom salt soaks help naturally heal or resolve if retained small foreign body - Rx topical bactroban antibiotic ointment BID 7-14 days for now - RICE  therapy - Follow-up if not improving - next step consider refer Podiatry for 2nd opinion may need exploration with superficial incision    Relevant Medications   mupirocin ointment (BACTROBAN) 2 %   Other Relevant Orders   DG Foot Complete Left      Meds ordered this encounter  Medications  . mupirocin ointment (BACTROBAN) 2 %  Sig: Apply 1 application topically 2 (two) times daily. For 7-14 days as needed left foot    Dispense:  22 g    Refill:  0      Follow up plan: Return in about 2 weeks (around 08/17/2017), or if symptoms worsen or fail to improve, for left foot pain swelling splinter.  Saralyn Pilar, DO [facility] 08/03/2017, 9:34 PM

## 2017-08-03 NOTE — Patient Instructions (Addendum)
Thank you for coming to the office today.  We will check X-ray of foot today - stay tuned for result.  Try to use topical antibiotic as prescribed as needed for this spot as long as it has a skin sore still and slightly red. For up to 2 weeks as needed.  Try epsom salt water soaks warm water few times a day as needed for next week or so to see if can help naturally draw any remaining splinter to surface and out  If needed can consider refer to Podiatry for 2nd opinion and may need a debridement of this area to check for other retained foreign body if not improving.  May need future oral antibiotics again if significant worsening  If not improving you may need to return for re-evaluation. But if more severe worsening such as spreading redness or streaking redness, significantly larger size, persistent drainage of pus, increased pain, fevers/chills, nausea vomiting and cannot take antibiotic. If significantly worse symptoms or most of these symptoms, would recommend going straight to Hospital Emergency Dept as you may require IV antibiotics instead.  Contact us before you go get labs drawn before your annual physical - we can release [facility] orders   Please schedule a Follow-up Appointment to: Return in about 2 weeks (around 08/17/2017), or if symptoms worsen or fail to improve, for left foot pain swelling splinter.  If you have any other questions or concerns, please feel free to call the office or send a message through MyChart. You may also schedule an earlier appointment if necessary.  Additionally, you may be receiving a survey about your experience at our office within a few days to 1 week by e-mail or mail. We value your feedback.  Saralyn PilarAlexander Finnick Orosz, DO [facility]

## 2017-08-27 ENCOUNTER — Ambulatory Visit (INDEPENDENT_AMBULATORY_CARE_PROVIDER_SITE_OTHER): Payer: Managed Care, Other (non HMO)

## 2017-08-27 ENCOUNTER — Ambulatory Visit: Payer: Managed Care, Other (non HMO) | Admitting: Podiatry

## 2017-08-27 ENCOUNTER — Encounter: Payer: Self-pay | Admitting: Podiatry

## 2017-08-27 DIAGNOSIS — M795 Residual foreign body in soft tissue: Secondary | ICD-10-CM

## 2017-08-27 MED ORDER — DOXYCYCLINE HYCLATE 100 MG PO TABS: 100.0000 mg | ORAL_TABLET | Freq: Two times a day (BID) | ORAL | 0 refills | Status: DC

## 2017-08-27 NOTE — Progress Notes (Signed)
This patient presents the office with chief complaint of pain noted on the top of his left foot.  He says that 5 weeks ago a piece of wood entered into the top of his left foot. He says that on July 13. He was seen at the urgent care [facility] where the splinter was removed. He says he was then prescribed Keflex which he took by mouth until July 23.  He says he will was then seen on July 29 for continued pain, swelling and redness at the site of the puncture wound.  This picture can be seen on his visit.  He was then dispense Bactroban 2%to apply topically to the foot.  He presents the office today for an evaluation of this left foot.  There is a persistent redness and swelling increased temperature noted in the left foot.  He presents the office today for a second opinion for his left foot. He  is also concerned about the thick nails of  third toenail, right foot. He has taken 2 rounds of Lamisil with no improvement.  General Appearance  Alert, conversant and in no acute stress.  Vascular  Dorsalis pedis and posterior tibial  pulses are palpable  bilaterally.  Capillary return is within normal limits  bilaterally. Temperature is within normal limits  bilaterally.  Neurologic  Senn-Weinstein monofilament wire test within normal limits  bilaterally. Muscle power within normal limits bilaterally.  Nails Normal nails noted with the exception third toe right foot.  Orthopedic  No limitations of motion of motion feet .  No crepitus or effusions noted.  No bony pathology or digital deformities noted.  Skin  normotropic skin with no porokeratosis noted bilaterally.  No signs of infections or ulcers noted.  uncture wound is noted at the medial aspect of the third MPJ left foot.  There is redness, swelling and increased temperature throughout his whole left foot.  Foreign body reaction left foot.    IE  Xrays taken reveal no bony pathology.  Patient was prescribed doxycycline for possible left foot infection.    Ordered an MRI  to r/o fb. Patient to be seen by Dr. Logan BoresEvans after the MRI is received.   Helane GuntherGregory Zelta Enfield DPM

## 2017-09-08 ENCOUNTER — Telehealth: Payer: Self-pay

## 2017-09-08 NOTE — Telephone Encounter (Signed)
Per Renisa with [facility], MRI has been approved from 09/08/17 to 12/07/17   Auth # [id number] Patient has been notified via voice mail to call and schedule own appt.

## 2017-10-01 ENCOUNTER — Ambulatory Visit
Admission: RE | Admit: 2017-10-01 | Discharge: 2017-10-01 | Disposition: A | Discharge status: Home / Self Care | Payer: Managed Care, Other (non HMO) | Source: Ambulatory Visit | Attending: Podiatry | Admitting: Podiatry

## 2017-10-01 DIAGNOSIS — S91332A Puncture wound without foreign body, left foot, initial encounter: Secondary | ICD-10-CM | POA: Diagnosis not present

## 2017-10-01 DIAGNOSIS — M795 Residual foreign body in soft tissue: Secondary | ICD-10-CM

## 2017-10-01 DIAGNOSIS — M7989 Other specified soft tissue disorders: Secondary | ICD-10-CM | POA: Insufficient documentation

## 2017-10-08 ENCOUNTER — Encounter: Payer: Self-pay | Admitting: Podiatry

## 2017-10-08 ENCOUNTER — Ambulatory Visit: Payer: Managed Care, Other (non HMO) | Admitting: Podiatry

## 2017-10-08 DIAGNOSIS — M795 Residual foreign body in soft tissue: Secondary | ICD-10-CM | POA: Diagnosis not present

## 2017-10-08 NOTE — Progress Notes (Signed)
This patient returns to the office for continued evaluation and treatment of his foreign body reaction and infection left  forefoot.  He was seen initially on 08/27/2017 and diagnosed as having a foreign body reaction and MRI was ordered.  The MRI revealed no evidence of any foreign body.  Patient states since he has been taking the doxycycline that his left foot has gotten a lot better.  He says the foot is no longer painful and that the swelling in the infection is improving every day.  He is very pleased with his progress.  He returns to the office today for continued evaluation and treatment of this foreign body reaction and cellulitis left forefoot.   Vascular  Dorsalis pedis and posterior tibial pulses are palpable  B/L.  Capillary return  WNL.  Temperature gradient is  WNL.  Skin turgor  WNL  Sensorium  Senn Weinstein monofilament wire  WNL. Normal tactile sensation.  Nail Exam  Patient has normal nails with the exception of third toe left foot.  Orthopedic  Exam  Muscle tone and muscle strength  WNL.  No limitations of motion feet  B/L.  No crepitus or joint effusion noted.  Foot type is unremarkable and digits show no abnormalities.  Bony prominences are unremarkable.  Skin  No open lesions.  Normal skin texture and turgor.  The signs of cellulitis has resolved and his whole left foot.  S/P infection left foot  ROV.  Patient has markedly improved since his initial visit.  We discussed possibly re-prescribing doxycycline for him to take but he desires not to take any further antibiotic.  He will call the office if he desires to start another round of antibiotics.    Helane Gunther DPM

## 2017-11-11 ENCOUNTER — Encounter: Payer: Self-pay | Admitting: Family Medicine

## 2017-11-11 ENCOUNTER — Encounter: Payer: Managed Care, Other (non HMO) | Admitting: Family Medicine

## 2017-11-11 ENCOUNTER — Ambulatory Visit (INDEPENDENT_AMBULATORY_CARE_PROVIDER_SITE_OTHER): Payer: Managed Care, Other (non HMO) | Admitting: Family Medicine

## 2017-11-11 VITALS — BP 102/69 | HR 73 | Temp 98.4°F | Resp 16 | Ht 73.0 in | Wt 154.0 lb

## 2017-11-11 DIAGNOSIS — N401 Enlarged prostate with lower urinary tract symptoms: Secondary | ICD-10-CM

## 2017-11-11 DIAGNOSIS — J3089 Other allergic rhinitis: Secondary | ICD-10-CM

## 2017-11-11 DIAGNOSIS — R200 Anesthesia of skin: Secondary | ICD-10-CM

## 2017-11-11 DIAGNOSIS — N138 Other obstructive and reflux uropathy: Secondary | ICD-10-CM

## 2017-11-11 DIAGNOSIS — Z0000 Encounter for general adult medical examination without abnormal findings: Secondary | ICD-10-CM

## 2017-11-11 MED ORDER — SAW PALMETTO (SERENOA REPENS) 80 MG PO CAPS: 80.0000 mg | ORAL_CAPSULE | Freq: Two times a day (BID) | ORAL | Status: AC

## 2017-11-11 NOTE — Assessment & Plan Note (Signed)
Stable, no active allergy symptoms Takes OTC anti histamine PRN flares, aware to take earlier or in advance for prevention

## 2017-11-11 NOTE — Patient Instructions (Addendum)
Thank you for coming to the office today.  Prostate is likely mildly enlarged  Consider Saw Palmetto OTC herbal supplement can take 80 to 160mg  up to twice a day  In future consider Flomax and rx medication.   AUA BPH Symptom Score over past 1 month 1. Sensation of not emptying bladder post void - 0 2. Urinate less than 2 hour after finish last void - 0 3. Start/Stop several times during void - 1 4. Difficult to postpone urination - 0 5. Weak urinary stream - 2 6. Push or strain urination - 1 7. Nocturia - 1 times  Send copy of lab results to MyChart, in future we will check yearly, notify office if need additional lab orders.  Hand mild wear and tear can cause arthritis in the smaller joints and thumb, keep track of this.  If flare up and worsening - recommend to start taking Tylenol Extra Strength 500mg  tabs - take 1 to 2 tabs per dose (max 1000mg ) every 6-8 hours for pain (take regularly, don't skip a dose for next 7 days), max 24 hour daily dose is 6 tablets or 3000mg . In the future you can repeat the same everyday Tylenol course for 1-2 weeks at a time.  Please schedule a Follow-up Appointment to: Return in about 1 year (around 11/12/2018) for Annual Physical.  If you have any other questions or concerns, please feel free to call the office or send a message through MyChart. You may also schedule an earlier appointment if necessary.  Additionally, you may be receiving a survey about your experience at our office within a few days to 1 week by e-mail or mail. We value your feedback.  Saralyn Pilar, DO [facility], [place]

## 2017-11-11 NOTE — Assessment & Plan Note (Deleted)
Chronic stable condition

## 2017-11-11 NOTE — Assessment & Plan Note (Signed)
Stable gradually improving See prior note for background No new concerns Reassurance

## 2017-11-11 NOTE — Progress Notes (Signed)
Subjective:    Patient ID: Andre Robbins, male    DOB: [birth year], 47 y.o.   MRN: [id number]  Andre Robbins is a 47 y.o. male presenting on 11/11/2017 for Annual Exam   HPI   Here for Annual Physical and Lab Review, he had labs done through [facility] - will send copy of results.  Lifestyle BMI >20 / History Elevated A1c Diet: He eats at home daily, occasional eat out lunch, often eats fish and vegetables Exercise: Walks 1-4 miles most days with walking dog, phone metric for walking distance avg 1.7 miles daily - in past A1c 5.5 to 5.6 now improved by his report - has result at home. Will send - Reported to have normal Cholesterol panel results.  Left Finger / Joint Pain Reports that he is concerned about some episodes of fairly increased frequency very brief sharp pain in middle/ring fingers last only a second, may happen few times a week now - Family history of arthritis in parents, likely osteoarthritis - Asking about arthritis and if he should be concerned. No prior x-ray or treatment - He admits changing to L handed mouse thinks using these fingers more now  Follow-up L Foot, puncture wound / Arthritis Updates from prior course in 07/2017 seen in UC treated w/ Keflex antibiotic, then in follow-up treated by me w/ mupirocin given clinical improvement. Ultimately he did not improve and was seen by [facility] Podiatry Dr Stacie Acres, they evaluated w/ x-ray and ultimately did MRI of foot to rule out foreign body, he improved on doxycycline, MRI showed some arthritis changes, otherwise just edema. He has dramatically improved now, this seems to have resolved.  Lower Urinary Tract Symptoms (LUTS), mild weak urinary stream Reports gradual problem with noticed some difficulty with urine over past >1 year, not acute change, seems gradual, see score below, only minor inconvenience by his report. But wanting to know about enlarged prostate. Never tried medicines for it. - He drinks 2 cups coffee  daily  11/11/17 AUA BPH Symptom Score over past 1 month 1. Sensation of not emptying bladder post void - 0 2. Urinate less than 2 hour after finish last void - 0 3. Start/Stop several times during void - 1 4. Difficult to postpone urination - 0 5. Weak urinary stream - 2 6. Push or strain urination - 1 7. Nocturia - 1 times  Total Score: 5 (Mild BPH symptoms)  Additional updates  Seasonal allergies - on antihistamine PRN  Still some tingling around R wrist, gradual improvement  Health Maintenance: UTD FLu Vaccine 11/04/17 TD TDap 10/2015 UTD Routine HIV screen 11/2016 negative  No fam history of prostate cancer. Not due for early screening - see above LUTS, offered PSA.  Paternal grandfather w/ colon cancer age 29 - not due for early screening.  Depression screen [facility] 2/9 11/11/2017 08/03/2017 11/19/2016  Decreased Interest 0 0 0  Down, Depressed, Hopeless 0 0 0  PHQ - 2 Score 0 0 0    Past Medical History:  Diagnosis Date  . Allergy   . Carpal tunnel syndrome   . Herpes    genital   History reviewed. No pertinent surgical history. Social History   Socioeconomic History  . Marital status: Married    Spouse name: Not on file  . Number of children: 1  . Years of education: PhD (Biology)  . Highest education level: Not on file  Occupational History  . Occupation: Chief Operating Officer - Production designer, theatre/television/film  Social Needs  . Physicist, medical  strain: Not on file  . Food insecurity:    Worry: Not on file    Inability: Not on file  . Transportation needs:    Medical: Not on file    Non-medical: Not on file  Tobacco Use  . Smoking status: Former Smoker    Packs/day: 0.50    Years: 25.00    Pack years: 12.50    Types: Cigarettes    Last attempt to quit: 10/06/2013    Years since quitting: 4.1  . Smokeless tobacco: Former Engineer, water and Sexual Activity  . Alcohol use: Yes    Alcohol/week: 6.0 standard drinks    Types: 6 Glasses of wine per week  . Drug use: No   . Sexual activity: Not on file  Lifestyle  . Physical activity:    Days per week: Not on file    Minutes per session: Not on file  . Stress: Not on file  Relationships  . Social connections:    Talks on phone: Not on file    Gets together: Not on file    Attends religious service: Not on file    Active member of club or organization: Not on file    Attends meetings of clubs or organizations: Not on file    Relationship status: Not on file  . Intimate partner violence:    Fear of current or ex partner: Not on file    Emotionally abused: Not on file    Physically abused: Not on file    Forced sexual activity: Not on file  Other Topics Concern  . Not on file  Social History Narrative  . Not on file   Family History  Problem Relation Age of Onset  . Kidney nephrosis Mother   . Depression Mother   . Depression Father   . Colon cancer Paternal Grandfather 85  . Prostate cancer Neg Hx    Current Outpatient Medications on File Prior to Visit  Medication Sig  . mupirocin ointment (BACTROBAN) 2 % Apply 1 application topically 2 (two) times daily. For 7-14 days as needed left foot  . valACYclovir (VALTREX) 500 MG tablet Take 1 tablet (500 mg total) 2 (two) times daily as needed by mouth. For 3 days per HSV flare as needed.   No current facility-administered medications on file prior to visit.     Review of Systems  Constitutional: Negative for activity change, appetite change, chills, diaphoresis, fatigue and fever.  HENT: Negative for congestion and hearing loss.   Eyes: Negative for visual disturbance.  Respiratory: Negative for apnea, cough, choking, chest tightness, shortness of breath and wheezing.   Cardiovascular: Negative for chest pain, palpitations and leg swelling.  Gastrointestinal: Negative for abdominal pain, anal bleeding, blood in stool, constipation, diarrhea, nausea and vomiting.  Endocrine: Negative for cold intolerance.  Genitourinary: Negative for decreased  urine volume, difficulty urinating, dysuria, frequency, hematuria, testicular pain and urgency.       Weak urinary stream  Musculoskeletal: Negative for arthralgias, back pain and neck pain.  Skin: Negative for rash.  Allergic/Immunologic: Negative for environmental allergies.  Neurological: Negative for dizziness, weakness, light-headedness, numbness and headaches.  Hematological: Negative for adenopathy.  Psychiatric/Behavioral: Negative for behavioral problems, dysphoric mood and sleep disturbance. The patient is not nervous/anxious.    Per HPI unless specifically indicated above     Objective:    BP 102/69   Pulse 73   Temp 98.4 F (36.9 C) (Oral)   Resp 16   Ht 6\' 1"  (  1.854 m)   Wt 154 lb (69.9 kg)   BMI 20.32 kg/m   Wt Readings from Last 3 Encounters:  11/11/17 154 lb (69.9 kg)  08/03/17 150 lb (68 kg)  01/14/17 151 lb (68.5 kg)    Physical Exam  Constitutional: He is oriented to person, place, and time. He appears well-developed and well-nourished. No distress.  Well-appearing, comfortable, cooperative  HENT:  Head: Normocephalic and atraumatic.  Mouth/Throat: Oropharynx is clear and moist.  Frontal / maxillary sinuses non-tender. Nares patent without purulence or edema. Bilateral TMs clear without erythema, effusion or bulging. Oropharynx clear without erythema, exudates, edema or asymmetry.  Eyes: Pupils are equal, round, and reactive to light. Conjunctivae and EOM are normal. Right eye exhibits no discharge. Left eye exhibits no discharge.  Neck: Normal range of motion. Neck supple. No thyromegaly present.  Cardiovascular: Normal rate, regular rhythm, normal heart sounds and intact distal pulses.  No murmur heard. Pulmonary/Chest: Effort normal and breath sounds normal. No respiratory distress. He has no wheezes. He has no rales.  Abdominal: Soft. Bowel sounds are normal. He exhibits no distension and no mass. There is no tenderness.  Genitourinary:  Genitourinary  Comments: Rectal/DRE: Normal external exam without hemorrhoids fissures or abnormality. DRE with palpation of mildly enlarged prostate smooth symmetrical without nodule or tenderness.  Musculoskeletal: Normal range of motion. He exhibits no edema or tenderness.  Upper / Lower Extremities: - Normal muscle tone, strength bilateral upper extremities 5/5, lower extremities 5/5  Bilateral hands, without obvious finger DIP PIP or MCP CMC deformity. Very minimal symmetrical angulation of long finger bilaterally. Grip normal, sensation intact. No bulky joints.  Lymphadenopathy:    He has no cervical adenopathy.  Neurological: He is alert and oriented to person, place, and time.  Distal sensation intact to light touch all extremities - except R forearm dorsal aspect seems improved now  Skin: Skin is warm and dry. No rash noted. He is not diaphoretic. No erythema.  Psychiatric: He has a normal mood and affect. His behavior is normal.  Well groomed, good eye contact, normal speech and thoughts  Nursing note and vitals reviewed.  Results for orders placed or performed during the hospital encounter of 01/10/17  Basic metabolic panel  Result Value Ref Range   Sodium 140 135 - 145 mmol/L   Potassium 3.6 3.5 - 5.1 mmol/L   Chloride 107 101 - 111 mmol/L   CO2 25 22 - 32 mmol/L   Glucose, Bld 92 65 - 99 mg/dL   BUN 19 6 - 20 mg/dL   Creatinine, Ser 4.33 0.61 - 1.24 mg/dL   Calcium 8.9 8.9 - 29.5 mg/dL   GFR calc non Af Amer >60 >60 mL/min   GFR calc Af Amer >60 >60 mL/min   Anion gap 8 5 - 15  CBC with Differential  Result Value Ref Range   WBC 9.9 3.8 - 10.6 K/uL   RBC 4.94 4.40 - 5.90 MIL/uL   Hemoglobin 15.4 13.0 - 18.0 g/dL   HCT 18.8 41.6 - 60.6 %   MCV 91.4 80.0 - 100.0 fL   MCH 31.1 26.0 - 34.0 pg   MCHC 34.1 32.0 - 36.0 g/dL   RDW 30.1 60.1 - 09.3 %   Platelets 218 150 - 440 K/uL   Neutrophils Relative % 70 %   Neutro Abs 7.0 (H) 1.4 - 6.5 K/uL   Lymphocytes Relative 20 %   Lymphs  Abs 2.0 1.0 - 3.6 K/uL   Monocytes Relative 8 %  Monocytes Absolute 0.8 0.2 - 1.0 K/uL   Eosinophils Relative 1 %   Eosinophils Absolute 0.0 0 - 0.7 K/uL   Basophils Relative 1 %   Basophils Absolute 0.0 0 - 0.1 K/uL      Assessment & Plan:   Problem List Items Addressed This Visit    BPH with obstruction/lower urinary tract symptoms    Consistent clinically with new diagnosis of BPH with LUTS - AUA BPH score 5 (11/11/17) - Never on med - Last PSA none available - Last DRE today 11/11/17 - mild enlarged symmetrical - No known personal/family history of prostate CA  Plan: 1. Reviewed diagnosis and management - Offered lifestyle intervention diet modification avoid caffeine bladder irritant, try double voiding strategy, monitor symptoms - Offered OTC herbal supplement - Saw Palmetto 80-160mg  BID for now - Will check PSA [facility] order printed - discussed benefits, risk of screening, reviewed potential for false positive / urology prostate bx if need 2. Follow-up sooner if need within 3-6 month otherwise yearly      Relevant Medications   saw palmetto 80 MG capsule   Other Relevant Orders   PSA   Environmental and seasonal allergies    Stable, no active allergy symptoms Takes OTC anti histamine PRN flares, aware to take earlier or in advance for prevention      Right arm numbness    Stable gradually improving See prior note for background No new concerns Reassurance       Other Visit Diagnoses    Annual physical exam    -  Primary      Updated Health Maintenance information Reviewed recent lab results with patient Encouraged improvement to lifestyle with diet and exercise  #Additional updates - counseled on fam history of suspected OA, reviewed general recommendations, prevention, and initial therapy Tylenol, activity modification, follow-up in future for X-ray if interested or worsening hand / finger symptoms  #L Foot Pain - resolved MRI reviewed S/p antibiotic  doxycycline  Meds ordered this encounter  Medications  . saw palmetto 80 MG capsule    Sig: Take 1-2 capsules (80-160 mg total) by mouth 2 (two) times daily.   Orders Placed This Encounter  Procedures  . PSA    Follow up plan: Return in about 1 year (around 11/12/2018) for Annual Physical.  Printed [facility] order for PSA to be drawn.  Future he may notify me if ready for [facility] orders that he can submit and that way I can get results directly.  Saralyn Pilar, DO [facility] 11/11/2017, 5:44 PM

## 2017-11-11 NOTE — Assessment & Plan Note (Signed)
Consistent clinically with new diagnosis of BPH with LUTS - AUA BPH score 5 (11/11/17) - Never on med - Last PSA none available - Last DRE today 11/11/17 - mild enlarged symmetrical - No known personal/family history of prostate CA  Plan: 1. Reviewed diagnosis and management - Offered lifestyle intervention diet modification avoid caffeine bladder irritant, try double voiding strategy, monitor symptoms - Offered OTC herbal supplement - Saw Palmetto 80-160mg  BID for now - Will check PSA [facility] order printed - discussed benefits, risk of screening, reviewed potential for false positive / urology prostate bx if need 2. Follow-up sooner if need within 3-6 month otherwise yearly

## 2018-10-25 ENCOUNTER — Telehealth: Payer: Managed Care, Other (non HMO) | Admitting: Family

## 2018-10-25 ENCOUNTER — Encounter (INDEPENDENT_AMBULATORY_CARE_PROVIDER_SITE_OTHER): Payer: Self-pay

## 2018-10-25 ENCOUNTER — Other Ambulatory Visit: Payer: Self-pay

## 2018-10-25 DIAGNOSIS — Z20822 Contact with and (suspected) exposure to covid-19: Secondary | ICD-10-CM

## 2018-10-25 DIAGNOSIS — Z20828 Contact with and (suspected) exposure to other viral communicable diseases: Secondary | ICD-10-CM

## 2018-10-25 MED ORDER — BENZONATATE 100 MG PO CAPS: 100.0000 mg | ORAL_CAPSULE | Freq: Three times a day (TID) | ORAL | 0 refills | Status: DC | PRN

## 2018-10-25 NOTE — Progress Notes (Unsigned)
la 

## 2018-10-25 NOTE — Progress Notes (Signed)
E-Visit for Corona Virus Screening   Your current symptoms could be consistent with the coronavirus.  Many health care providers can now test patients at their office but not all are.  [facility] has multiple testing sites. For information on our COVID testing locations and hours go to [email or web address]  Please quarantine yourself while awaiting your test results.  We are enrolling you in our MyChart Home Montioring for COVID19 . Daily you will receive a questionnaire within the MyChart website. Our COVID 19 response team willl be monitoriing your responses daily.  You can go to one of the  testing sites listed below, while they are opened (see hours). You do not need an order and will stay in your car during the test. You do need to self isolate until your results return and if positive 14 days from when your symptoms started and until you are 3 days symptom free.   Testing Locations (Monday - Friday, 8 a.m. - 3:30 p.m.) . [place]: [facility], [address], [place], [place]  . [place]: [facility], [address], [place], [place] (entrance off [address])  . [place]: (Closed each Monday): Testing site relocated to the short stay covered drive at [facility]. (Use the [address] entrance to [facility] next to [facility].)   COVID-19 is a respiratory illness with symptoms that are similar to the flu. Symptoms are typically mild to moderate, but there have been cases of severe illness and death due to the virus. The following symptoms may appear 2-14 days after exposure: . Fever . Cough . Shortness of breath or difficulty breathing . Chills . Repeated shaking with chills . Muscle pain . Headache . Sore throat . New loss of taste or smell . Fatigue . Congestion or runny nose . Nausea or vomiting . Diarrhea  It is vitally important that if you feel that you  have an infection such as this virus or any other virus that you stay home and away from places where you may spread it to others.  You should self-quarantine for 14 days if you have symptoms that could potentially be coronavirus or have been in close contact a with a person diagnosed with COVID-19 within the last 2 weeks. You should avoid contact with people age 65 and older.   You should wear a mask or cloth face covering over your nose and mouth if you must be around other people or animals, including pets (even at home). Try to stay at least 6 feet away from other people. This will protect the people around you.  You can use medication such as A prescription cough medication called Tessalon Perles 100 mg. You may take 1-2 capsules every 8 hours as needed for cough.  You may also take acetaminophen (Tylenol) as needed for fever.   Reduce your risk of any infection by using the same precautions used for avoiding the common cold or flu:  . Wash your hands often with soap and warm water for at least 20 seconds.  If soap and water are not readily available, use an alcohol-based hand sanitizer with at least 60% alcohol.  . If coughing or sneezing, cover your mouth and nose by coughing or sneezing into the elbow areas of your shirt or coat, into a tissue or into your sleeve (not your hands). . Avoid shaking hands with others and consider head nods or verbal greetings only. . Avoid touching   Avoid touching your eyes, nose, or mouth with unwashed hands.  . Avoid close contact with people who are sick. . Avoid places or events with large numbers of people in one location, like concerts or sporting events. . Carefully consider travel plans you have or are making. . If you are planning any travel outside or inside the [place], visit the CDC's Travelers' Health webpage for the latest health notices. . If you have some symptoms but not all symptoms, continue to monitor at home and seek medical attention if your symptoms  worsen. . If you are having a medical emergency, call 911.  HOME CARE . Only take medications as instructed by your medical team. . Drink plenty of fluids and get plenty of rest. . A steam or ultrasonic humidifier can help if you have congestion.   GET HELP RIGHT AWAY IF YOU HAVE EMERGENCY WARNING SIGNS** FOR COVID-19. If you or someone is showing any of these signs seek emergency medical care immediately. Call 911 or proceed to your closest emergency facility if: . You develop worsening high fever. . Trouble breathing . Bluish lips or face . Persistent pain or pressure in the chest . New confusion . Inability to wake or stay awake . You cough up blood. . Your symptoms become more severe  **This list is not all possible symptoms. Contact your medical provider for any symptoms that are sever or concerning to you.   MAKE SURE YOU   Understand these instructions.  Will watch your condition.  Will get help right away if you are not doing well or get worse.  Your e-visit answers were reviewed by a board certified advanced clinical practitioner to complete your personal care plan.  Depending on the condition, your plan could have included both over the counter or prescription medications.  If there is a problem please reply once you have received a response from your provider.  Your safety is important to us.  If you have drug allergies check your prescription carefully.    You can use MyChart to ask questions about today's visit, request a non-urgent call back, or ask for a work or school excuse for 24 hours related to this e-Visit. If it has been greater than 24 hours you will need to follow up with your provider, or enter a new e-Visit to address those concerns. You will get an e-mail in the next two days asking about your experience.  I hope that your e-visit has been valuable and will speed your recovery. Thank you for using e-visits.    

## 2018-10-26 ENCOUNTER — Encounter (INDEPENDENT_AMBULATORY_CARE_PROVIDER_SITE_OTHER): Payer: Self-pay

## 2018-10-27 ENCOUNTER — Encounter (INDEPENDENT_AMBULATORY_CARE_PROVIDER_SITE_OTHER): Payer: Self-pay

## 2018-10-27 LAB — NOVEL CORONAVIRUS, NAA: SARS-CoV-2, NAA: NOT DETECTED

## 2018-10-28 ENCOUNTER — Encounter (INDEPENDENT_AMBULATORY_CARE_PROVIDER_SITE_OTHER): Payer: Self-pay

## 2018-10-31 ENCOUNTER — Encounter (INDEPENDENT_AMBULATORY_CARE_PROVIDER_SITE_OTHER): Payer: Self-pay

## 2018-11-01 ENCOUNTER — Encounter (INDEPENDENT_AMBULATORY_CARE_PROVIDER_SITE_OTHER): Payer: Self-pay

## 2018-11-05 ENCOUNTER — Encounter (INDEPENDENT_AMBULATORY_CARE_PROVIDER_SITE_OTHER): Payer: Self-pay

## 2019-03-17 ENCOUNTER — Ambulatory Visit: Payer: Managed Care, Other (non HMO) | Attending: Internal Medicine

## 2019-03-17 DIAGNOSIS — Z23 Encounter for immunization: Secondary | ICD-10-CM

## 2019-03-17 NOTE — Progress Notes (Signed)
Covid-19 Vaccination Clinic  Name:  Seab Rhame    MRN: [id number] DOB: 23-Jun-1970  03/17/2019  Mr. Bressi was observed post Covid-19 immunization for 15 minutes without incident. He was provided with Vaccine Information Sheet and instruction to access the V-Safe system.   Mr. Nakazawa was instructed to call 911 with any severe reactions post vaccine: Marland Kitchen Difficulty breathing  . Swelling of face and throat  . A fast heartbeat  . A bad rash all over body  . Dizziness and weakness   Immunizations Administered    Name Date Dose VIS Date Route   Pfizer COVID-19 Vaccine 03/17/2019  9:42 AM 0.3 mL 12/17/2018 Intramuscular   Manufacturer: [employer], [employer]   Lot: [id number]   NDC: [address]-2

## 2019-04-12 ENCOUNTER — Ambulatory Visit: Payer: Managed Care, Other (non HMO) | Attending: Internal Medicine

## 2019-04-12 DIAGNOSIS — Z23 Encounter for immunization: Secondary | ICD-10-CM

## 2019-04-12 NOTE — Progress Notes (Signed)
Covid-19 Vaccination Clinic  Name:  Linton Lortz    MRN: [id number] DOB: 08-19-1970  04/12/2019  Mr. Sponsel was observed post Covid-19 immunization for 15 minutes without incident. He was provided with Vaccine Information Sheet and instruction to access the V-Safe system.   Mr. Kornfeld was instructed to call 911 with any severe reactions post vaccine: Marland Kitchen Difficulty breathing  . Swelling of face and throat  . A fast heartbeat  . A bad rash all over body  . Dizziness and weakness   Immunizations Administered    Name Date Dose VIS Date Route   Pfizer COVID-19 Vaccine 04/12/2019  8:28 AM 0.3 mL 12/17/2018 Intramuscular   Manufacturer: [employer], [employer]   Lot: [id number]   NDC: [address]-2

## 2020-05-28 IMAGING — MR MR FOOT*L* W/O CM
5 series · 40 of 40 positions shown · non-contrast
Comparison: None.

CLINICAL DATA: Pt had splinter in foot a couple of months ago. Pt
states splinter was removed, but infection persists. Swelling and
and stiffness at base of 3rd toe. Pt has had 2 rounds of antibiotics
without improvement.

EXAM:
MRI OF THE LEFT FOOT WITHOUT CONTRAST
TECHNIQUE: Multiplanar, multisequence MR imaging of the left forefoot was
performed. No intravenous contrast was administered.

[Series 4: T1 · coronal · left · 3.0mm · 0.47mm/px · 10 of 39 slices shown (1 of 2)]
[im 1/39]
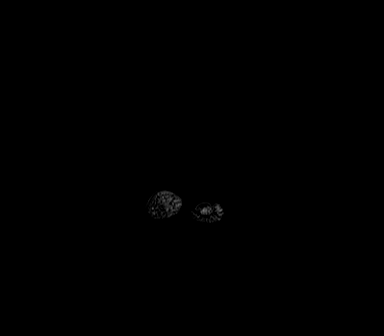
[im 5/39]
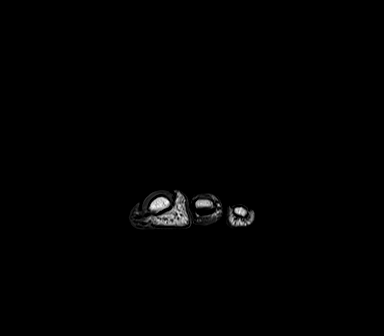
[im 9/39]
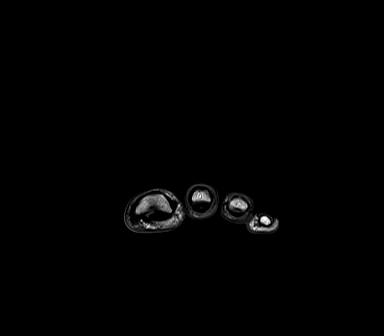
[im 13/39]
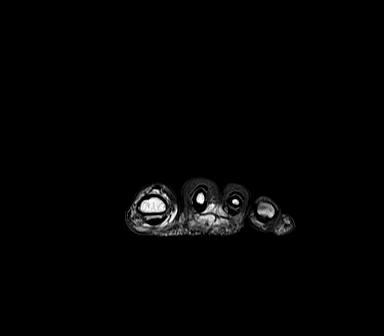
[im 17/39]
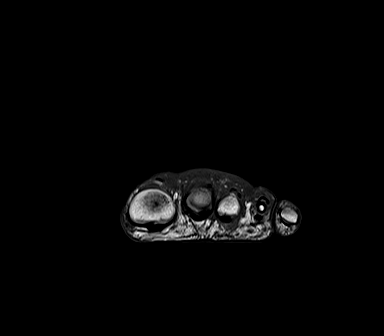
[im 22/39]
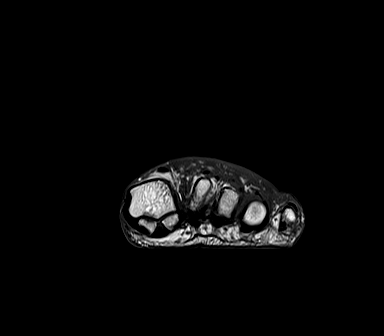
[im 26/39]
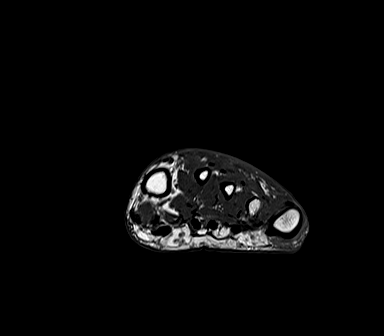
[im 30/39]
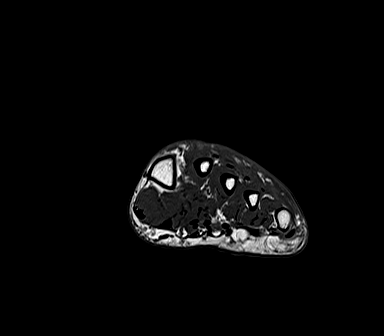
[im 34/39]
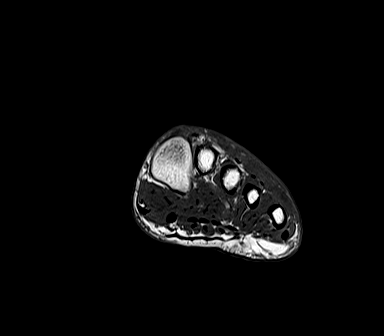
[im 39/39]
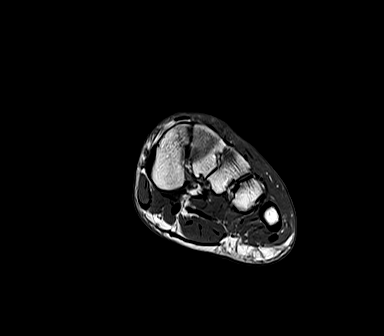

[Series 5: T2 fat-sat · coronal · left · 3.0mm · 0.47mm/px · 10 of 39 slices shown (1 of 2)]
[im 1/39]
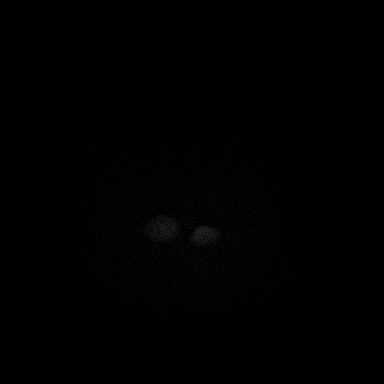
[im 5/39]
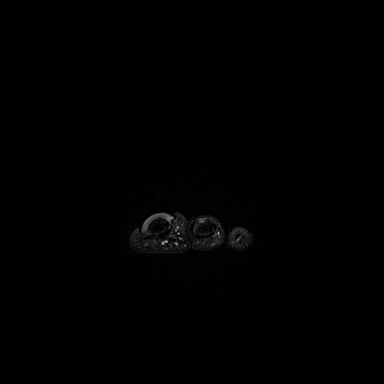
[im 9/39]
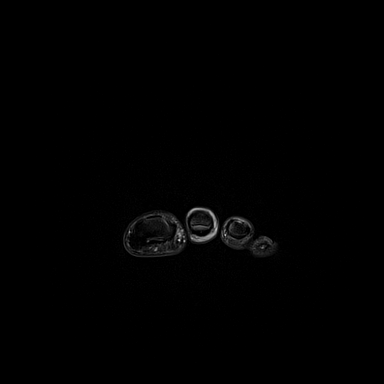
[im 13/39]
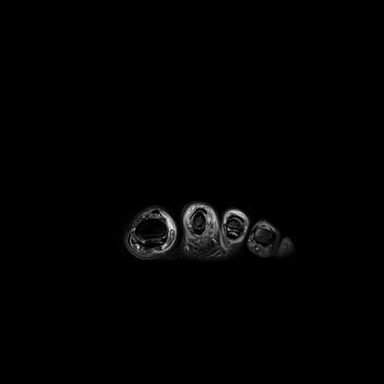
[im 17/39]
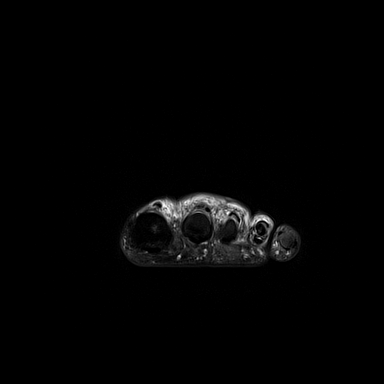
[im 22/39]
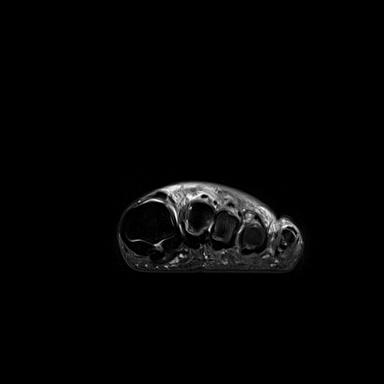
[im 26/39]
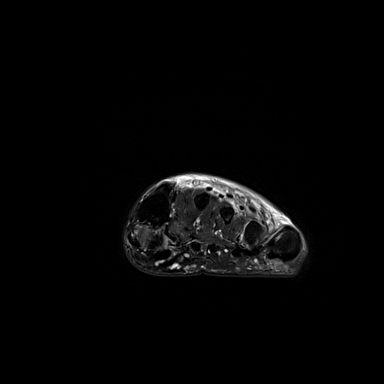
[im 30/39]
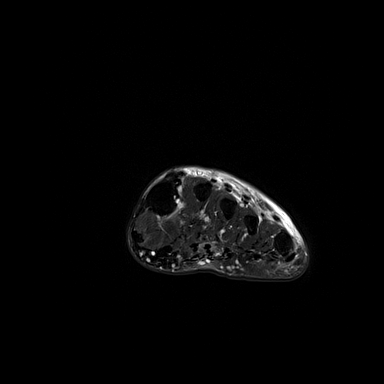
[im 34/39]
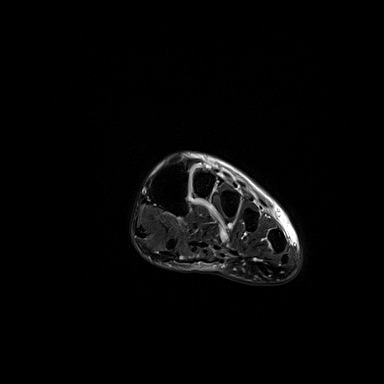
[im 39/39]
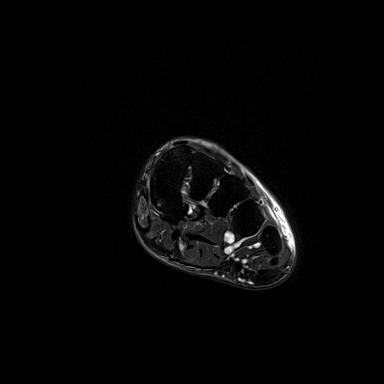

[Series 6: T1 · axial · left · 3.0mm · 0.47mm/px · z∈[-97,-16]mm · 6 of 22 slices shown (2 of 2)]
[im 1/22]
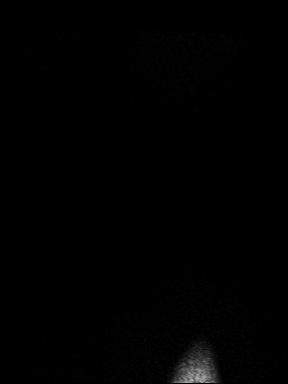
[im 5/22]
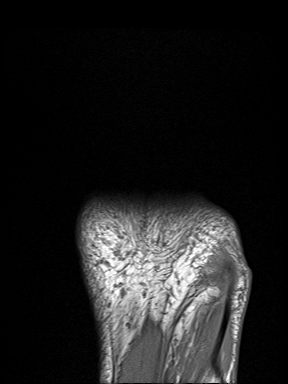
[im 9/22]
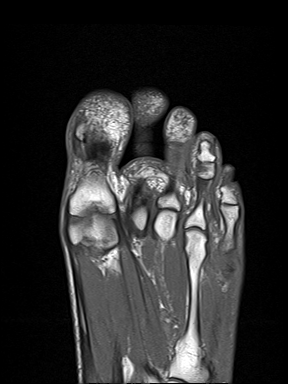
[im 13/22]
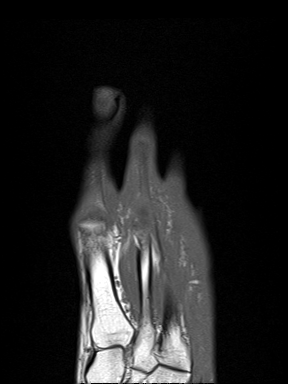
[im 17/22]
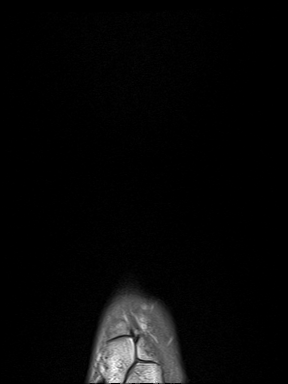
[im 22/22]
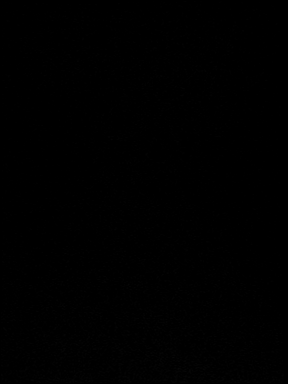

[Series 7: T2 fat-sat · axial · left · 3.0mm · 0.54mm/px · z∈[-97,-16]mm · 6 of 22 slices shown (2 of 2)]
[im 1/22]
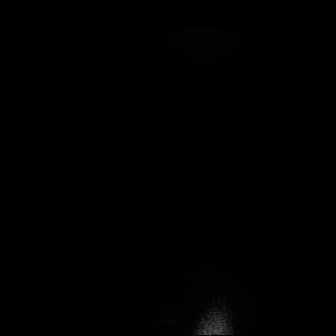
[im 5/22]
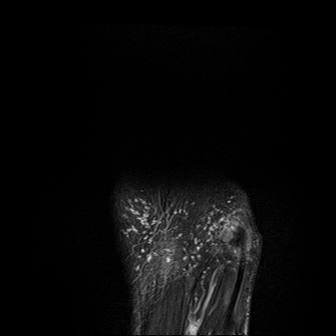
[im 9/22]
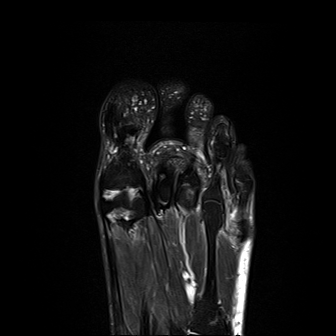
[im 13/22]
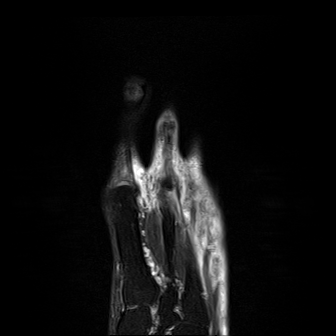
[im 17/22]
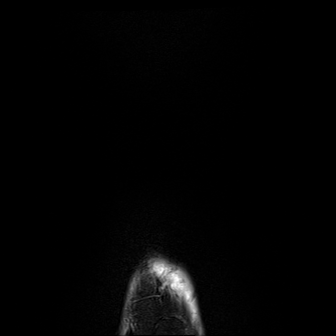
[im 22/22]
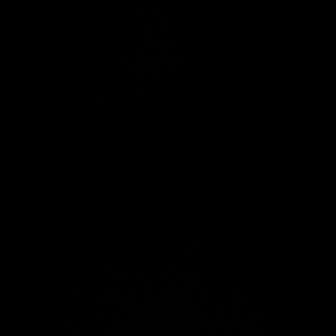

[Series 8: STIR · sagittal · left · 3.0mm · 0.70mm/px · 8 of 30 slices shown]
[im 1/30]
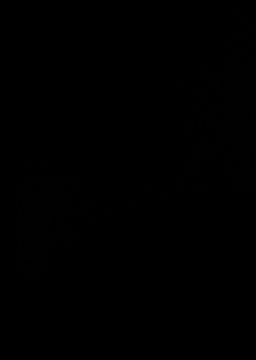
[im 5/30]
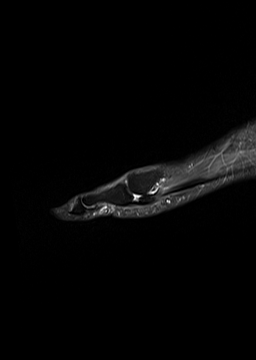
[im 9/30]
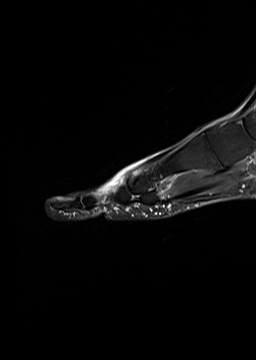
[im 13/30]
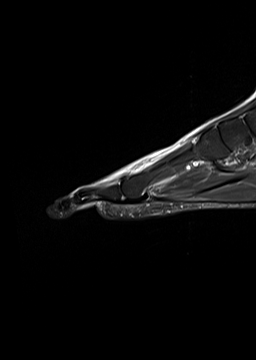
[im 17/30]
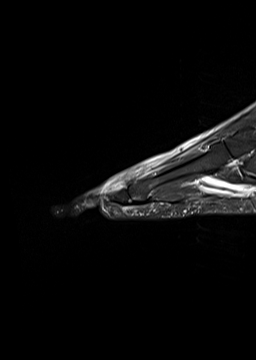
[im 21/30]
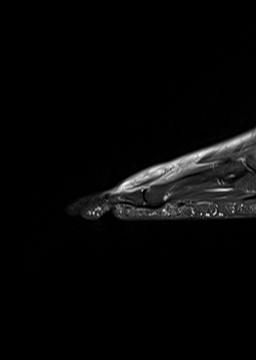
[im 25/30]
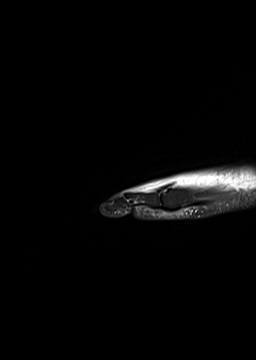
[im 30/30]
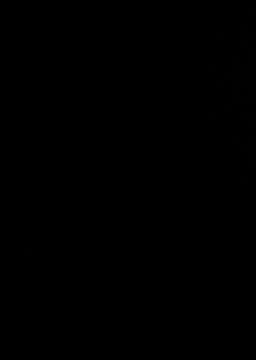

[40 of 40 positions shown; findings below may reference images not displayed]

FINDINGS: Bones/Joint/Cartilage

At the site of the puncture wound along the plantar aspect of third
MTP joint there is mild soft tissue edema. Mild subchondral marrow
edema in the plantar aspect of the third metatarsal head. No
definite cortical destruction or periosteal reaction.

No fracture or dislocation. Normal alignment. No joint effusion.
Mild osteoarthritis of the first MTP joint.

Ligaments

Collateral ligaments are intact.  Lisfranc ligament is intact.

Muscles and Tendons
Muscles are normal. Flexor, extensor and peroneal tendons are
intact.

Soft tissue
No fluid collection or hematoma. No soft tissue mass. Generalized
soft tissue edema along the dorsal aspect of the midfoot and
forefoot which may be reactive versus secondary to cellulitis.
IMPRESSION: 1. At the site of the puncture wound along the plantar aspect of
third MTP joint there is mild soft tissue edema. Mild subchondral
marrow edema in the plantar aspect of the third metatarsal head. No
definite cortical destruction or periosteal reaction. This may
reflect reactive edema secondary to overlying inflammation or injury
versus mild residual osteomyelitis but this is considered less
likely.
2. Generalized soft tissue edema along the dorsal aspect of the
midfoot and forefoot which may be reactive versus secondary to
cellulitis.

## 2021-03-26 ENCOUNTER — Ambulatory Visit: Payer: Self-pay | Admitting: *Deleted

## 2021-03-26 NOTE — Telephone Encounter (Signed)
?  Chief Complaint: thinks he may have strep throat, Sore throat, swollen, sore lymph nodes, body aches.  Has not tested for Covid.  Suggested he do so. ?Symptoms: see above ?Frequency: Started Sunday morning ?Pertinent Negatives: Patient denies Fever at this point, exposure to strep throat, "I actually feel better today but I want to be tested for strep just in case I need an antibiotic".   ?Disposition: [] ED /[x] Urgent Care (no appt availability in office) / [] Appointment(In office/virtual)/ []  [facility]/ [] Home Care/ [] Refused Recommended Disposition /[] [facility]/ []  Follow-up with PCP ?Additional Notes: Pt already had an appt with [facility] for 9:30 tomorrow morning so he is going there since there were no appts available at [facility].     ?

## 2021-03-26 NOTE — Telephone Encounter (Signed)
I returned pt's call.  He tested positive for strep throat and is wanting to be seen sooner.  No appts for 2 weeks. ? ? ?Reason for Disposition ? [1] Sore throat is the only symptom AND [2] present > 48 hours ? ?Answer Assessment - Initial Assessment Questions ?1. ONSET: "When did the throat start hurting?" (Hours or days ago)  ?    Sunday morning started.  Aching all over.  Swollen lymph nodes that are sore.     ?2. SEVERITY: "How bad is the sore throat?" (Scale 1-10; mild, moderate or severe) ?  - MILD (1-3):  doesn't interfere with eating or normal activities ?  - MODERATE (4-7): interferes with eating some solids and normal activities ?  - SEVERE (8-10):  excruciating pain, interferes with most normal activities ?  - SEVERE DYSPHAGIA: can't swallow liquids, drooling ?    *No Answer* ?3. STREP EXPOSURE: "Has there been any exposure to strep within the past week?" If Yes, ask: "What type of contact occurred?"  ?    Possible ?4.  VIRAL SYMPTOMS: "Are there any symptoms of a cold, such as a runny nose, cough, hoarse voice or red eyes?"  ?    above ?5. FEVER: "Do you have a fever?" If Yes, ask: "What is your temperature, how was it measured, and when did it start?" ?    Low grade on Monday  body aches ?6. PUS ON THE TONSILS: "Is there pus on the tonsils in the back of your throat?" ?    No ?7. OTHER SYMPTOMS: "Do you have any other symptoms?" (e.g., difficulty breathing, headache, rash) ?     ?8. PREGNANCY: "Is there any chance you are pregnant?" "When was your last menstrual period?" ?    N/A ? ?Protocols used: Sore Throat-A-AH ? ?

## 2021-03-27 DIAGNOSIS — J020 Streptococcal pharyngitis: Secondary | ICD-10-CM | POA: Diagnosis not present

## 2021-03-27 DIAGNOSIS — J029 Acute pharyngitis, unspecified: Secondary | ICD-10-CM | POA: Diagnosis not present

## 2021-09-09 DIAGNOSIS — B029 Zoster without complications: Secondary | ICD-10-CM | POA: Diagnosis not present

## 2021-09-30 ENCOUNTER — Encounter: Payer: Managed Care, Other (non HMO) | Admitting: Family Medicine

## 2021-10-05 ENCOUNTER — Other Ambulatory Visit: Payer: Self-pay | Admitting: Family Medicine

## 2021-10-05 DIAGNOSIS — A6009 Herpesviral infection of other urogenital tract: Secondary | ICD-10-CM

## 2021-10-07 MED ORDER — VALACYCLOVIR HCL 500 MG PO TABS: 500.0000 mg | ORAL_TABLET | Freq: Two times a day (BID) | ORAL | 0 refills | Status: DC | PRN

## 2021-10-29 ENCOUNTER — Ambulatory Visit (LOCAL_COMMUNITY_HEALTH_CENTER): Payer: BC Managed Care – PPO

## 2021-10-29 DIAGNOSIS — Z719 Counseling, unspecified: Secondary | ICD-10-CM

## 2021-10-29 DIAGNOSIS — Z23 Encounter for immunization: Secondary | ICD-10-CM

## 2021-10-29 NOTE — Progress Notes (Signed)
  Are you feeling sick today? No   Have you ever received a dose of COVID-19 Vaccine? [employer], [place], [place], [place], Other) Yes  If yes, which vaccine and how many doses?   PFIZER, 4   Did you bring the vaccination record card or other documentation?  No   Do you have a health condition or are undergoing treatment that makes you moderately or severely immunocompromised? This would include, but not be limited to: cancer, HIV, organ transplant, immunosuppressive therapy/high-dose corticosteroids, or moderate/severe primary immunodeficiency.  No  Have you received COVID-19 vaccine before or during hematopoietic cell transplant (HCT) or CAR-T-cell therapies? No  Have you ever had an allergic reaction to: (This would include a severe allergic reaction or a reaction that caused hives, swelling, or respiratory distress, including wheezing.) A component of a COVID-19 vaccine or a previous dose of COVID-19 vaccine? No   Have you ever had an allergic reaction to another vaccine (other thanCOVID-19 vaccine) or an injectable medication? (This would include a severe allergic reaction or a reaction that caused hives, swelling, or respiratory distress, including wheezing.)   No    Do you have a history of any of the following:  Myocarditis or Pericarditis No  Dermal fillers:  No  Multisystem Inflammatory Syndrome (MIS-C or MIS-A)? No  COVID-19 disease within the past 3 months? No  Vaccinated with monkeypox vaccine in the last 4 weeks? No  Eligible and administered Fluzone and Comirnaty 12y+2023-2024, monitored, tolerated well. Understood VIS and NCIR copy. M.Judiann Celia, LPN.

## 2021-12-17 ENCOUNTER — Ambulatory Visit (INDEPENDENT_AMBULATORY_CARE_PROVIDER_SITE_OTHER): Payer: BC Managed Care – PPO | Admitting: Family Medicine

## 2021-12-17 ENCOUNTER — Encounter: Payer: Self-pay | Admitting: Family Medicine

## 2021-12-17 VITALS — BP 127/74 | HR 63 | Ht 73.0 in | Wt 151.8 lb

## 2021-12-17 DIAGNOSIS — Z125 Encounter for screening for malignant neoplasm of prostate: Secondary | ICD-10-CM

## 2021-12-17 DIAGNOSIS — Z0000 Encounter for general adult medical examination without abnormal findings: Secondary | ICD-10-CM | POA: Diagnosis not present

## 2021-12-17 DIAGNOSIS — Z1211 Encounter for screening for malignant neoplasm of colon: Secondary | ICD-10-CM

## 2021-12-17 DIAGNOSIS — Z1159 Encounter for screening for other viral diseases: Secondary | ICD-10-CM

## 2021-12-17 DIAGNOSIS — Z131 Encounter for screening for diabetes mellitus: Secondary | ICD-10-CM

## 2021-12-17 DIAGNOSIS — L821 Other seborrheic keratosis: Secondary | ICD-10-CM

## 2021-12-17 NOTE — Patient Instructions (Addendum)
Thank you for coming to the office today.  Seborrheic keratosis (seb-o-REE-ik care-uh-TOE-sis) is a common skin growth. It may seem worrisome because it can look like a wart, pre-cancerous skin growth (actinic keratosis), or skin cancer. Despite their appearance, seborrheic keratoses are harmless. Most people get these growths when they are middle aged or older. Because they begin at a later age and can have a wart-like appearance, seborrheic keratoses are often called the "barnacles of aging." It's possible to have just one of these growths, but most people develop several. Some growths may have a warty surface while others look like dabs of warm, brown candle wax on the skin. Seborrheic keratoses range in color from white to black; however, most are tan or brown. You can find these harmless growths anywhere on the skin, except the palms and soles. Most often, you'll see them on the chest, back, head, or neck.  [place]   [place], [place] [address] Hours: 8AM-5PM Phone: [phone]  [facility] Dermatologist in [place], [place] Address: [address] [place], [place] [address] Phone: [phone]  [facility] [place], [place] [address] Phone: [phone]  Recommend Shingles Vaccine still. 2 doses, 2-6 months apart, strong reaction immune response, can schedule w/ nurse visit here or at pharmacy. Schedule NURSE VISIT ONLY for vaccine.  Ordered the Cologuard (home kit) test for colon cancer screening. Stay tuned for further updates.  It will be shipped to you directly. If not received in 2-4 weeks, call us or the company.   If you send it back and no results are received in 2-4 weeks, call us or the company as well!   Colon Cancer Screening: - For all adults age 11+ routine colon cancer screening is highly recommended.     - Recent guidelines from [place] recommend starting age of  68 - Early detection of colon cancer is important, because often there are no warning signs or symptoms, also if found early usually it can be cured. Late stage is hard to treat.   - If Cologuard is NEGATIVE, then it is good for 3 years before next due - If Cologuard is POSITIVE, then it is strongly advised to get a Colonoscopy, which allows the GI doctor to locate the source of the cancer or polyp (even very early stage) and treat it by removing it. ------------------------- Follow instructions to collect sample, you may call the company for any help or questions, 24/7 telephone support at [phone].  Sleep Hygiene Recommendations to promote healthy sleep in all patients, especially if symptoms of insomnia are worsening. Due to the nature of sleep rhythms, if your body gets "out of rhythm", it may take some time before your sleep cycle can be "reset".  Please try to follow as many of the following tips as you can, usually there are only a few of these are the primary cause of the problem.  ?To reset your sleep rhythm, go to bed and get up at the same time every day ?Sleep only long enough to feel rested and then get out of bed ?Do not try to force yourself to sleep. If you can't sleep, get out of bed and try again later. ?Avoid naps during the day, unless excessively tired. The more sleeping during the day, then the less sleep your body needs at night.  ?Have coffee, tea, and other foods that have caffeine only in the morning ?Exercise several days a  week, but not right before bed ?If you drink alcohol, prefer to have appropriate drink with one meal, but prefer to avoid alcohol in the evening, and bedtime ?If you smoke, avoid smoking, especially in the evening  ?Avoid watching TV or looking at phones, computers, or reading devices ("e-books") that give off light at least 30 minutes before bed. This artificial light sends "awake signals" to your brain and can make it harder to fall  asleep. ?Make your bedroom a comfortable place where it is easy to fall asleep: Put up shades or special blackout curtains to block light from outside. Use a white noise machine to block noise. Keep the temperature cool. ?Try your best to solve or at least address your problems before you go to bed ?Use relaxation techniques to manage stress. Ask your health care provider to suggest some techniques that may work well for you. These may include: Breathing exercises. Routines to release muscle tension. Visualizing peaceful scenes.   Please schedule a Follow-up Appointment to: Return for 1 year Annual Physical ([facility] orders after).  If you have any other questions or concerns, please feel free to call the office or send a message through [facility]. You may also schedule an earlier appointment if necessary.  Additionally, you may be receiving a survey about your experience at our office within a few days to 1 week by e-mail or mail. We value your feedback.  Nobie Putnam, DO [place]

## 2021-12-17 NOTE — Progress Notes (Signed)
Subjective:    Patient ID: Andre Robbins, male    DOB: 26-Apr-1970, 51 y.o.   MRN: [id number]  Andre Robbins is a 51 y.o. male presenting on 12/17/2021 for Annual Exam   HPI  Here for Annual Physical and [facility] orders today   Lifestyle BMI >20 / History Elevated A1c Diet: He eats at home daily, occasional eat out lunch, often eats fish and vegetables Exercise: Bicycle to work and weekends, walking regularly, add some swimming. Also his daughter is on year round swimming. - in past A1c 5.4 to 5.6 - Reported to have normal Cholesterol panel results.    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 Sensation of not emptying bladder post void - 0 2. Urinate less than 2 hour after finish last void - 0 3. Start/Stop several times during void - 1 4. Difficult to postpone urination - 0 5. Weak urinary stream - 0 (2 off medication) 6. Push or strain urination - 0 (1 off medication) 7. Nocturia - 1 times   Total Score: 1-2 on med and (5 off med) (Mild BPH symptoms)    Additional updates  Admits some adjustment stressors w/ anxiety this year - due to work stressors, new boss etc, and overall improving but long process. Tried Melatonin years ago, will consider this again. Aware of sleep hygiene and meditation as well.   Seasonal allergies - on antihistamine PRN     Health Maintenance: UTD Flu and COVID booster  TD TDap 10/2015  History of Shingles flare summer 2023. Will return for Shingles vaccine. He had R side scalp/head ear area flare before but was very mild and has resolved.   No fam history of prostate cancer. Not due for early screening - see above LUTS, offered PSA.   Paternal grandfather w/ colon cancer age 15 - not due for early screening.       12/17/2021    1:16 PM 11/11/2017    3:47 PM 08/03/2017    1:25 PM  Depression screen PHQ 2/9  Decreased Interest 0 0 0  Down, Depressed, Hopeless 1 0 0  PHQ - 2 Score 1 0 0  Altered sleeping 2    Tired, decreased energy 1    Change in appetite 0    Feeling bad or failure about yourself  0    Trouble concentrating 0    Moving slowly or fidgety/restless 0    Suicidal thoughts 0    PHQ-9 Score 4    Difficult doing work/chores Somewhat difficult        12/17/2021    1:16 PM  GAD 7 : Generalized Anxiety Score  Nervous, Anxious, on Edge 1  Control/stop worrying 2  Worry too much - different things 2  Trouble relaxing 2  Restless 1  Easily annoyed or irritable 2  Afraid - awful might happen 0  Total GAD 7 Score 10  Anxiety Difficulty Not difficult at all    Past Medical History:  Diagnosis Date   Allergy    Carpal tunnel syndrome    Herpes    genital   History reviewed. No pertinent surgical history. 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, theatre/television/film  Tobacco Use   Smoking status: Former    Packs/day: 0.50    Years: 25.00    Total pack years: 12.50    Types: Cigarettes    Quit date: 10/06/2013    Years since quitting: 8.2   Smokeless tobacco: Former  Substance and Sexual Activity   Alcohol use: Yes    Alcohol/week: 6.0 standard drinks of alcohol    Types: 6 Glasses of wine per week   Drug use: No   Sexual activity: Not on file  Other Topics Concern   Not on file  Social History Narrative   Not on file   Social Determinants of Health   Financial Resource Strain: Not on file  Food Insecurity: Not on file  Transportation Needs: Not on file  Physical Activity: Not on file  Stress: Not on file  Social Connections: Not on file  Intimate Partner  Violence: Not on file   Family History  Problem Relation Age of Onset   Kidney nephrosis Mother    Depression Mother    Depression Father    Colon cancer Paternal Grandfather 10   Prostate cancer Neg Hx    Current Outpatient Medications on File Prior to Visit  Medication Sig   mupirocin ointment (BACTROBAN) 2 % Apply 1 application topically 2 (two) times daily. For 7-14 days as needed left foot   saw palmetto 80 MG capsule Take 1-2 capsules (80-160 mg total) by mouth 2 (two) times daily.   valACYclovir (VALTREX) 500 MG tablet Take 1 tablet (500 mg total) by mouth 2 (two) times daily as needed. For 3 days per HSV flare as needed.   No current facility-administered medications on file prior to visit.    Review of Systems  Constitutional:  Negative for activity change, appetite change, chills, diaphoresis, fatigue and fever.  HENT:  Negative for congestion and hearing loss.   Eyes:  Negative for visual disturbance.  Respiratory:  Negative for cough, chest tightness, shortness of breath and wheezing.   Cardiovascular:  Negative for chest pain, palpitations and leg swelling.  Gastrointestinal:  Negative for abdominal pain, constipation, diarrhea, nausea and vomiting.  Genitourinary:  Negative for dysuria, frequency and hematuria.  Musculoskeletal:  Negative for arthralgias and neck pain.  Skin:  Negative for rash.  Neurological:  Negative for dizziness, weakness, light-headedness, numbness and headaches.  Hematological:  Negative for adenopathy.  Psychiatric/Behavioral:  Negative for behavioral problems, dysphoric mood and sleep disturbance.    Per HPI unless specifically indicated above     Objective:    BP 127/74   Pulse 63   Ht 6\' 1"  (1.854 m)   Wt 151 lb 12.8 oz (68.9 kg)   SpO2 100%   BMI 20.03 kg/m   Wt Readings from Last 3 Encounters:  12/17/21 151 lb 12.8 oz (68.9 kg)  11/11/17 154 lb (69.9 kg)  08/03/17 150 lb (68 kg)    Physical Exam Vitals and nursing note  reviewed.  Constitutional:      General: He is not in acute distress.    Appearance: He is well-developed. He is not diaphoretic.     Comments: Well-appearing, comfortable, cooperative  HENT:     Head: Normocephalic and atraumatic.  Eyes:     General:        Right eye: No discharge.        Left eye:  No discharge.     Conjunctiva/sclera: Conjunctivae normal.     Pupils: Pupils are equal, round, and reactive to light.  Neck:     Thyroid: No thyromegaly.     Vascular: No carotid bruit.  Cardiovascular:     Rate and Rhythm: Normal rate and regular rhythm.     Pulses: Normal pulses.     Heart sounds: Normal heart sounds. No murmur heard. Pulmonary:     Effort: Pulmonary effort is normal. No respiratory distress.     Breath sounds: Normal breath sounds. No wheezing or rales.  Abdominal:     General: Bowel sounds are normal. There is no distension.     Palpations: Abdomen is soft. There is no mass.     Tenderness: There is no abdominal tenderness.  Musculoskeletal:        General: No tenderness. Normal range of motion.     Cervical back: Normal range of motion and neck supple.     Right lower leg: No edema.     Left lower leg: No edema.     Comments: Upper / Lower Extremities: - Normal muscle tone, strength bilateral upper extremities 5/5, lower extremities 5/5  Lymphadenopathy:     Cervical: No cervical adenopathy.  Skin:    General: Skin is warm and dry.     Findings: Lesion (SKs large left flank upper back multiple spots) present. No erythema or rash.  Neurological:     Mental Status: He is alert and oriented to person, place, and time.     Comments: Distal sensation intact to light touch all extremities  Psychiatric:        Mood and Affect: Mood normal.        Behavior: Behavior normal.        Thought Content: Thought content normal.     Comments: Well groomed, good eye contact, normal speech and thoughts     Results for orders placed or performed in visit on 10/25/18   Novel Coronavirus, NAA ([facility])   Specimen: Nasopharyngeal(NP) swabs in vial transport medium   NASOPHARYNGE  TESTING  Result Value Ref Range   SARS-CoV-2, NAA Not Detected Not Detected      Assessment & Plan:   Problem List Items Addressed This Visit   None Visit Diagnoses     Annual physical exam    -  Primary   Relevant Orders   CBC with Differential/Platelet   Lipid panel   Hemoglobin A1c   TSH   Comprehensive metabolic panel   Screening for colon cancer       Relevant Orders   Cologuard   Need for hepatitis C screening test       Relevant Orders   Hepatitis C antibody   Screening for prostate cancer       Relevant Orders   PSA   Screening for diabetes mellitus       Relevant Orders   Hemoglobin A1c   SK (seborrheic keratosis)           Updated Health Maintenance information Future fasting lab orders to [facility], given to patient Encouraged improvement to lifestyle with diet and exercise Goal of weight loss  Multiple SKs - can refer to Dermatology for cryotherapy removal  Due for routine colon cancer screening. Never had colonoscopy (not interested), no family history colon cancer. - Discussion today about recommendations for either Colonoscopy or Cologuard screening, benefits and risks of screening, interested in Cologuard, understands that if positive then recommendation is for diagnostic colonoscopy  to follow-up. - Ordered Cologuard today   Orders Placed This Encounter  Procedures   Cologuard   CBC with Differential/Platelet   Lipid panel    Order Specific Question:   Has the patient fasted?    Answer:   Yes   Hemoglobin A1c   PSA   Hepatitis C antibody   TSH   Comprehensive metabolic panel    Order Specific Question:   Has the patient fasted?    Answer:   Yes     No orders of the defined types were placed in this encounter.    Follow up plan: Return for 1 year Annual Physical ([facility] orders after).  Will add Hep C  screening   Saralyn Pilar, DO [facility] 12/17/2021, 1:25 PM

## 2021-12-20 ENCOUNTER — Other Ambulatory Visit: Payer: Self-pay | Admitting: Family Medicine

## 2021-12-20 DIAGNOSIS — Z1159 Encounter for screening for other viral diseases: Secondary | ICD-10-CM | POA: Diagnosis not present

## 2021-12-20 DIAGNOSIS — Z125 Encounter for screening for malignant neoplasm of prostate: Secondary | ICD-10-CM | POA: Diagnosis not present

## 2021-12-20 DIAGNOSIS — Z0000 Encounter for general adult medical examination without abnormal findings: Secondary | ICD-10-CM | POA: Diagnosis not present

## 2021-12-20 DIAGNOSIS — Z131 Encounter for screening for diabetes mellitus: Secondary | ICD-10-CM | POA: Diagnosis not present

## 2021-12-20 DIAGNOSIS — E7800 Pure hypercholesterolemia, unspecified: Secondary | ICD-10-CM | POA: Diagnosis not present

## 2021-12-23 LAB — COMPREHENSIVE METABOLIC PANEL
ALT: 12 IU/L (ref 0–44)
AST: 17 IU/L (ref 0–40)
Albumin/Globulin Ratio: 1.9 (ref 1.2–2.2)
Albumin: 3.9 g/dL (ref 3.8–4.9)
Alkaline Phosphatase: 68 IU/L (ref 44–121)
BUN/Creatinine Ratio: 11 (ref 9–20)
BUN: 13 mg/dL (ref 6–24)
Bilirubin Total: 0.8 mg/dL (ref 0.0–1.2)
CO2: 21 mmol/L (ref 20–29)
Calcium: 9.5 mg/dL (ref 8.7–10.2)
Chloride: 103 mmol/L (ref 96–106)
Creatinine, Ser: 1.19 mg/dL (ref 0.76–1.27)
Globulin, Total: 2.1 g/dL (ref 1.5–4.5)
Glucose: 100 mg/dL — ABNORMAL HIGH (ref 70–99)
Potassium: 4.4 mmol/L (ref 3.5–5.2)
Sodium: 140 mmol/L (ref 134–144)
Total Protein: 6 g/dL (ref 6.0–8.5)
eGFR: 73 mL/min/{1.73_m2} (ref >59)

## 2021-12-23 LAB — CBC WITH DIFFERENTIAL/PLATELET
Basophils Absolute: 0 10*3/uL (ref 0.0–0.2)
Basos: 1 %
EOS (ABSOLUTE): 0.1 10*3/uL (ref 0.0–0.4)
Eos: 1 %
Hematocrit: 47.1 % (ref 37.5–51.0)
Hemoglobin: 15.9 g/dL (ref 13.0–17.7)
Immature Grans (Abs): 0 10*3/uL (ref 0.0–0.1)
Immature Granulocytes: 0 %
Lymphocytes Absolute: 2.2 10*3/uL (ref 0.7–3.1)
Lymphs: 44 %
MCH: 31.9 pg (ref 26.6–33.0)
MCHC: 33.8 g/dL (ref 31.5–35.7)
MCV: 94 fL (ref 79–97)
Monocytes Absolute: 0.4 10*3/uL (ref 0.1–0.9)
Monocytes: 7 %
Neutrophils Absolute: 2.3 10*3/uL (ref 1.4–7.0)
Neutrophils: 47 %
Platelets: 211 10*3/uL (ref 150–450)
RBC: 4.99 x10E6/uL (ref 4.14–5.80)
RDW: 12 % (ref 11.6–15.4)
WBC: 5 10*3/uL (ref 3.4–10.8)

## 2021-12-23 LAB — TSH: TSH: 2.1 u[IU]/mL (ref 0.450–4.500)

## 2021-12-23 LAB — LIPID PANEL
Chol/HDL Ratio: 2.9 ratio (ref 0.0–5.0)
Cholesterol, Total: 211 mg/dL — ABNORMAL HIGH (ref 100–199)
HDL: 74 mg/dL (ref >39)
LDL Chol Calc (NIH): 123 mg/dL — ABNORMAL HIGH (ref 0–99)
Triglycerides: 79 mg/dL (ref 0–149)
VLDL Cholesterol Cal: 14 mg/dL (ref 5–40)

## 2021-12-23 LAB — HEMOGLOBIN A1C
Est. average glucose Bld gHb Est-mCnc: 114 mg/dL
Hgb A1c MFr Bld: 5.6 % (ref 4.8–5.6)

## 2021-12-23 LAB — HEPATITIS C ANTIBODY: Hep C Virus Ab: NONREACTIVE

## 2021-12-23 LAB — PSA: Prostate Specific Ag, Serum: 0.7 ng/mL (ref 0.0–4.0)

## 2022-01-01 DIAGNOSIS — Z1211 Encounter for screening for malignant neoplasm of colon: Secondary | ICD-10-CM | POA: Diagnosis not present

## 2022-01-08 LAB — COLOGUARD: COLOGUARD: NEGATIVE

## 2022-02-09 ENCOUNTER — Emergency Department: Payer: BC Managed Care – PPO

## 2022-02-09 ENCOUNTER — Emergency Department
Admission: EM | Admit: 2022-02-09 | Discharge: 2022-02-09 | Disposition: A | Discharge status: Home / Self Care | Payer: BC Managed Care – PPO | Attending: Emergency Medicine | Admitting: Emergency Medicine

## 2022-02-09 DIAGNOSIS — S42145A Nondisplaced fracture of glenoid cavity of scapula, left shoulder, initial encounter for closed fracture: Secondary | ICD-10-CM | POA: Diagnosis not present

## 2022-02-09 DIAGNOSIS — W010XXA Fall on same level from slipping, tripping and stumbling without subsequent striking against object, initial encounter: Secondary | ICD-10-CM | POA: Insufficient documentation

## 2022-02-09 DIAGNOSIS — S42142A Displaced fracture of glenoid cavity of scapula, left shoulder, initial encounter for closed fracture: Secondary | ICD-10-CM | POA: Diagnosis not present

## 2022-02-09 DIAGNOSIS — S4992XA Unspecified injury of left shoulder and upper arm, initial encounter: Secondary | ICD-10-CM | POA: Diagnosis not present

## 2022-02-09 DIAGNOSIS — M79622 Pain in left upper arm: Secondary | ICD-10-CM | POA: Diagnosis not present

## 2022-02-09 DIAGNOSIS — M25512 Pain in left shoulder: Secondary | ICD-10-CM | POA: Diagnosis not present

## 2022-02-09 DIAGNOSIS — Y9301 Activity, walking, marching and hiking: Secondary | ICD-10-CM | POA: Insufficient documentation

## 2022-02-09 MED ORDER — NAPROXEN 500 MG PO TABS: 500.0000 mg | ORAL_TABLET | Freq: Two times a day (BID) | ORAL | 0 refills | Status: DC

## 2022-02-09 MED ORDER — IBUPROFEN 400 MG PO TABS
400.0000 mg | ORAL_TABLET | Freq: Once | ORAL | Status: AC
  Administered 2022-02-09: 400 mg via ORAL
  Filled 2022-02-09: qty 1

## 2022-02-09 MED ORDER — ACETAMINOPHEN 500 MG PO TABS: 1000.0000 mg | ORAL_TABLET | Freq: Four times a day (QID) | ORAL | 2 refills | Status: AC | PRN

## 2022-02-09 MED ORDER — ACETAMINOPHEN 500 MG PO TABS
1000.0000 mg | ORAL_TABLET | Freq: Once | ORAL | Status: AC
  Administered 2022-02-09: 1000 mg via ORAL
  Filled 2022-02-09: qty 2

## 2022-02-09 NOTE — ED Triage Notes (Signed)
Pt sts that he was walking up the stairs at  his house and tripped and hyper extended his left arm and felt a large amount of pain in the shoulder.

## 2022-02-09 NOTE — Discharge Instructions (Addendum)
-  You have a fracture along your glenoid.  No urgent interventions need to be done at this time, however you will need to schedule a follow-up appointment with the orthopedic surgeon listed on this page (Dr. Posey Pronto).  You may take Tylenol and naproxen as needed for pain.  Please keep your arm in a sling throughout the day.  -Return to the emergency department anytime if you begin to experience any new or worsening symptoms.

## 2022-02-09 NOTE — ED Provider Notes (Signed)
[facility] Provider Note    Event Date/Time   First MD Initiated Contact with Patient 02/09/22 1925     (approximate)   History   Chief Complaint Shoulder Injury   HPI Andre Robbins is a 52 y.o. male, history of carpal tunnel, presents to the emergency department for evaluation of left shoulder injury.  He states that today he was walking upstairs carrying something in his left hand when he tripped, causing his left arm to become hyperextended over his head.  Since then, he has had increased pain with range of motion in the left shoulder joint.  Denies any head injuries.  Denies fever/chills, paresthesias, cold sensation, elbow pain, forearm pain, wrist pain, weakness, or dizziness/lightheadedness.  History Limitations: No limitations.        Physical Exam  Triage Vital Signs: ED Triage Vitals  Enc Vitals Group     BP 02/09/22 1826 115/82     Pulse Rate 02/09/22 1826 74     Resp 02/09/22 1826 17     Temp 02/09/22 1826 98.4 F (36.9 C)     Temp Source 02/09/22 1826 Oral     SpO2 02/09/22 1826 100 %     Weight 02/09/22 1827 150 lb (68 kg)     Height --      Head Circumference --      Peak Flow --      Pain Score 02/09/22 1827 10     Pain Loc --      Pain Edu? --      Excl. in [place]? --     Most recent vital signs: Vitals:   02/09/22 1826 02/09/22 2100  BP: 115/82 120/70  Pulse: 74 65  Resp: 17 16  Temp: 98.4 F (36.9 C)   SpO2: 100% 100%    General: Awake, NAD.  Skin: Warm, dry. No rashes or lesions.  Eyes: PERRL. Conjunctivae normal.  CV: Good peripheral perfusion.  Resp: Normal effort.  Abd: Soft, non-tender. No distention.  Neuro: At baseline. No gross neurological deficits.  Musculoskeletal: Normal ROM of all extremities.  Focused Exam: No gross deformities to the left upper extremity.  He maintains normal range of motion at the shoulder joint and elbow joint.  He does not have any significant tenderness along the humerus or  clavicle.  Negative empty can test.  Physical Exam    ED Results / Procedures / Treatments  Labs (all labs ordered are listed, but only abnormal results are displayed) Labs Reviewed - No data to display   EKG N/A.    RADIOLOGY  ED Provider Interpretation: I personally reviewed and interpreted these images, CT shoulder shows displaced fracture through the anterior left glenoid.  No other injuries present.  Left humerus x-ray shows no acute abnormalities.  CT Shoulder Left Wo Contrast  Result Date: 02/09/2022 CLINICAL DATA:  Shoulder trauma, possible fracture on plain film. EXAM: CT OF THE UPPER LEFT EXTREMITY WITHOUT CONTRAST TECHNIQUE: Multidetector CT imaging of the upper left extremity was performed according to the standard protocol. RADIATION DOSE REDUCTION: This exam was performed according to the departmental dose-optimization program which includes automated exposure control, adjustment of the mA and/or kV according to patient size and/or use of iterative reconstruction technique. COMPARISON:  Shoulder series today. FINDINGS: Bones/Joint/Cartilage Fracture through the anterior glenoid with mild displacement. Small bone fragment also noted adjacent to the humeral head as seen on plain films. No proximal humeral abnormality. Ligaments Suboptimally assessed by CT. Muscles and Tendons Grossly unremarkable.  Soft tissues Soft tissues are intact.  Scarring in the apex of the left lung. IMPRESSION: Displaced fracture through the anterior left glenoid. Electronically Signed   By: Rolm Baptise M.D.   On: 02/09/2022 20:14   DG Shoulder Left  Result Date: 02/09/2022 CLINICAL DATA:  Left shoulder pain post fall. EXAM: LEFT SHOULDER - 2+ VIEW COMPARISON:  None Available. FINDINGS: There is a 9 mm calcific fragment adjacent to the medial left humeral head. No donor site is seen. Soft tissues are normal. No significant osteoarthritic changes. IMPRESSION: 9 mm calcific fragment adjacent to the medial  left humeral head, suggestive of acute fracture. However no donor site is seen. Further evaluation with CT or MRI of the shoulder may be considered. Electronically Signed   By: Fidela Salisbury M.D.   On: 02/09/2022 19:26   DG Humerus Left  Result Date: 02/09/2022 CLINICAL DATA:  [place], pain EXAM: LEFT HUMERUS - 2+ VIEW COMPARISON:  None Available. FINDINGS: Frontal and lateral views of the left humerus are obtained. No acute fracture. Alignment of the left shoulder and elbow is anatomic. Soft tissues are normal. IMPRESSION: 1. Unremarkable left humerus. Electronically Signed   By: Randa Ngo M.D.   On: 02/09/2022 19:14    PROCEDURES:  Critical Care performed: N/A.  Procedures    MEDICATIONS ORDERED IN ED: Medications  acetaminophen (TYLENOL) tablet 1,000 mg (1,000 mg Oral Given 02/09/22 2117)  ibuprofen (ADVIL) tablet 400 mg (400 mg Oral Given 02/09/22 2117)     IMPRESSION / MDM / ASSESSMENT AND PLAN / ED COURSE  I reviewed the triage vital signs and the nursing notes.                              Differential diagnosis includes, but is not limited to, humerus fracture, shoulder dislocation, clavicle fracture, rotator cuff injury, labral tear   Assessment/Plan Patient presents with left shoulder injury following mechanical fall causing reported hyperextension of his left upper extremity.  He appears well clinically.  No significant findings on physical exam, however his CT does show a displaced fracture to the anterior left glenoid.  Spoke about this case with on-call orthopedic surgeon, Dr. Posey Pronto, who recommended placing patient in a sling and have him follow-up with orthopedist within the next week.  Patient was amenable to this.  Provided with a short course of analgesics.  Will discharge.  Considered admission, but given his stable presentation and access to follow-up, is unlikely benefit from admission.  Provided the patient with anticipatory guidance, return precautions, and  educational material. Encouraged the patient to return to the emergency department at any time if they begin to experience any new or worsening symptoms. Patient expressed understanding and agreed with the plan.   Patient's presentation is most consistent with acute complicated illness / injury requiring diagnostic workup.       FINAL CLINICAL IMPRESSION(S) / ED DIAGNOSES   Final diagnoses:  Glenoid fracture of shoulder, left, closed, initial encounter     Rx / DC Orders   ED Discharge Orders          Ordered    acetaminophen (TYLENOL) 500 MG tablet  Every 6 hours PRN        02/09/22 2103    naproxen (NAPROSYN) 500 MG tablet  2 times daily with meals        02/09/22 2103             Note:  This document was prepared using Dragon voice recognition software and may include unintentional dictation errors.   Teodoro Spray, [place] 02/09/22 3300    Lucillie Garfinkel, MD 02/09/22 [phone]

## 2022-02-10 ENCOUNTER — Other Ambulatory Visit: Payer: Self-pay | Admitting: Orthopedic Surgery

## 2022-02-11 ENCOUNTER — Encounter
Admission: RE | Admit: 2022-02-11 | Discharge: 2022-02-11 | Disposition: A | Discharge status: Home / Self Care | Payer: BC Managed Care – PPO | Source: Ambulatory Visit | Attending: Orthopedic Surgery | Admitting: Orthopedic Surgery

## 2022-02-11 NOTE — Patient Instructions (Addendum)
Your procedure is scheduled on: Thursday, February 8 Report to the Registration Desk on the 1st floor of the [facility]. To find out your arrival time, please call [phone] between 1PM - 3PM on: Wednesday, February 7 If your arrival time is 6:00 am, do not arrive before that time as the [facility] entrance doors do not open until 6:00 am.  REMEMBER: Instructions that are not followed completely may result in serious medical risk, up to and including death; or upon the discretion of your surgeon and anesthesiologist your surgery may need to be rescheduled.  Do not eat food after midnight the night before surgery.  No gum chewing or hard candies.  You may however, drink CLEAR liquids up to 2 hours before you are scheduled to arrive for your surgery. Do not drink anything within 2 hours of your scheduled arrival time.  Clear liquids include: - water  - apple juice without pulp - gatorade (not RED colors) - black coffee or tea (Do NOT add milk or creamers to the coffee or tea) Do NOT drink anything that is not on this list.  In addition, your doctor has ordered for you to drink the provided:  Ensure Pre-Surgery Clear Carbohydrate Drink  Drinking this carbohydrate drink up to two hours before surgery helps to reduce insulin resistance and improve patient outcomes. Please complete drinking 2 hours before scheduled arrival time.  One week prior to surgery: starting today, February 6 Stop Anti-inflammatories (NSAIDS) such as Advil, Aleve, Ibuprofen, Motrin, Naproxen, Naprosyn and Aspirin based products such as Excedrin, Goody's Powder, BC Powder. Stop ANY OVER THE COUNTER supplements until after surgery. Stop Saw palmetto. You may however, continue to take Tylenol if needed for pain up until the day of surgery.  DO NOT TAKE ANY MEDICATIONS THE MORNING OF SURGERY  No Alcohol for 24 hours before or after surgery.  No Smoking including e-cigarettes for 24 hours before surgery.  No  chewable tobacco products for at least 6 hours before surgery.  No nicotine patches on the day of surgery.  Do not use any "recreational" drugs for at least a week (preferably 2 weeks) before your surgery.  Please be advised that the combination of cocaine and anesthesia may have negative outcomes, up to and including death. If you test positive for cocaine, your surgery will be cancelled.  On the morning of surgery brush your teeth with toothpaste and water, you may rinse your mouth with mouthwash if you wish. Do not swallow any toothpaste or mouthwash.  Use CHG Soap as directed on instruction sheet.  Do not wear jewelry, make-up, hairpins, clips or nail polish.  Do not wear lotions, powders, or perfumes.   Do not shave body hair from the neck down 48 hours before surgery.  Contact lenses, hearing aids and dentures may not be worn into surgery.  Do not bring valuables to the hospital. [facility] is not responsible for any missing/lost belongings or valuables.   Notify your doctor if there is any change in your medical condition (cold, fever, infection).  Wear comfortable clothing (specific to your surgery type) to the hospital.  After surgery, you can help prevent lung complications by doing breathing exercises.  Take deep breaths and cough every 1-2 hours. Your doctor may order a device called an Incentive Spirometer to help you take deep breaths.  If you are being discharged the day of surgery, you will not be allowed to drive home. You will need a responsible individual to drive  you home and stay with you for 24 hours after surgery.   If you are taking public transportation, you will need to have a responsible individual with you.  Please call the [place] Dept. at [phone] if you have any questions about these instructions.  Surgery Visitation Policy:  Patients undergoing a surgery or procedure may have two family members or support persons with them as  long as the person is not COVID-19 positive or experiencing its symptoms.      Preparing for Surgery with CHLORHEXIDINE GLUCONATE (CHG) Soap  Chlorhexidine Gluconate (CHG) Soap  o An antiseptic cleaner that kills germs and bonds with the skin to continue killing germs even after washing  o Used for showering the night before surgery and morning of surgery  Before surgery, you can play an important role by reducing the number of germs on your skin.  CHG (Chlorhexidine gluconate) soap is an antiseptic cleanser which kills germs and bonds with the skin to continue killing germs even after washing.  Please do not use if you have an allergy to CHG or antibacterial soaps. If your skin becomes reddened/irritated stop using the CHG.  1. Shower the NIGHT BEFORE SURGERY and the MORNING OF SURGERY with CHG soap.  2. If you choose to wash your hair, wash your hair first as usual with your normal shampoo.  3. After shampooing, rinse your hair and body thoroughly to remove the shampoo.  4. Use CHG as you would any other liquid soap. You can apply CHG directly to the skin and wash gently with a scrungie or a clean washcloth.  5. Apply the CHG soap to your body only from the neck down. Do not use on open wounds or open sores. Avoid contact with your eyes, ears, mouth, and genitals (private parts). Wash face and genitals (private parts) with your normal soap.  6. Wash thoroughly, paying special attention to the area where your surgery will be performed.  7. Thoroughly rinse your body with warm water.  8. Do not shower/wash with your normal soap after using and rinsing off the CHG soap.  9. Pat yourself dry with a clean towel.  10. Wear clean pajamas to bed the night before surgery.  12. Place clean sheets on your bed the night of your first shower and do not sleep with pets.  13. Shower again with the CHG soap on the day of surgery prior to arriving at the hospital.  14. Do not apply any  deodorants/lotions/powders.  15. Please wear clean clothes to the hospital.

## 2022-02-12 DIAGNOSIS — S42142A Displaced fracture of glenoid cavity of scapula, left shoulder, initial encounter for closed fracture: Secondary | ICD-10-CM | POA: Diagnosis not present

## 2022-02-13 ENCOUNTER — Other Ambulatory Visit: Payer: Self-pay

## 2022-02-13 ENCOUNTER — Ambulatory Visit: Payer: BC Managed Care – PPO | Admitting: Certified Registered Nurse Anesthetist

## 2022-02-13 ENCOUNTER — Encounter: Payer: Self-pay | Admitting: Orthopedic Surgery

## 2022-02-13 ENCOUNTER — Encounter
Admission: RE | Disposition: A | Discharge status: Home / Self Care | Payer: Self-pay | Source: Home / Self Care | Attending: Orthopedic Surgery

## 2022-02-13 ENCOUNTER — Ambulatory Visit: Payer: BC Managed Care – PPO

## 2022-02-13 ENCOUNTER — Ambulatory Visit
Admission: RE | Admit: 2022-02-13 | Discharge: 2022-02-13 | Disposition: A | Discharge status: Home / Self Care | Payer: BC Managed Care – PPO | Attending: Orthopedic Surgery | Admitting: Orthopedic Surgery

## 2022-02-13 DIAGNOSIS — G8918 Other acute postprocedural pain: Secondary | ICD-10-CM | POA: Diagnosis not present

## 2022-02-13 DIAGNOSIS — Z4789 Encounter for other orthopedic aftercare: Secondary | ICD-10-CM | POA: Diagnosis not present

## 2022-02-13 DIAGNOSIS — Z87891 Personal history of nicotine dependence: Secondary | ICD-10-CM | POA: Diagnosis not present

## 2022-02-13 DIAGNOSIS — S42142A Displaced fracture of glenoid cavity of scapula, left shoulder, initial encounter for closed fracture: Secondary | ICD-10-CM | POA: Diagnosis not present

## 2022-02-13 DIAGNOSIS — W109XXA Fall (on) (from) unspecified stairs and steps, initial encounter: Secondary | ICD-10-CM | POA: Diagnosis not present

## 2022-02-13 DIAGNOSIS — G709 Myoneural disorder, unspecified: Secondary | ICD-10-CM | POA: Diagnosis not present

## 2022-02-13 HISTORY — PX: ORIF SHOULDER FRACTURE: SHX5035

## 2022-02-13 LAB — GLUCOSE, CAPILLARY: Glucose-Capillary: 172 mg/dL — ABNORMAL HIGH (ref 70–99)

## 2022-02-13 SURGERY — OPEN REDUCTION INTERNAL FIXATION (ORIF) SHOULDER FRACTURE
Anesthesia: General | Site: Shoulder | Laterality: Left

## 2022-02-13 MED ORDER — ACETAMINOPHEN 500 MG PO TABS: 1000.0000 mg | ORAL_TABLET | Freq: Three times a day (TID) | ORAL | 2 refills | Status: DC

## 2022-02-13 MED ORDER — ONDANSETRON HCL 4 MG/2ML IJ SOLN
INTRAMUSCULAR | Status: AC
  Filled 2022-02-13: qty 2

## 2022-02-13 MED ORDER — FENTANYL CITRATE (PF) 100 MCG/2ML IJ SOLN: 25.0000 ug | INTRAMUSCULAR | Status: DC | PRN

## 2022-02-13 MED ORDER — HYDROMORPHONE HCL 1 MG/ML IJ SOLN: 0.2500 mg | INTRAMUSCULAR | Status: DC | PRN

## 2022-02-13 MED ORDER — PROPOFOL 10 MG/ML IV BOLUS
INTRAVENOUS | Status: AC
  Filled 2022-02-13: qty 20

## 2022-02-13 MED ORDER — DROPERIDOL 2.5 MG/ML IJ SOLN
0.6250 mg | Freq: Once | INTRAMUSCULAR | Status: AC | PRN
  Administered 2022-02-13: 0.625 mg via INTRAVENOUS

## 2022-02-13 MED ORDER — DEXAMETHASONE SODIUM PHOSPHATE 10 MG/ML IJ SOLN
INTRAMUSCULAR | Status: AC
  Filled 2022-02-13: qty 1

## 2022-02-13 MED ORDER — ONDANSETRON HCL 4 MG/2ML IJ SOLN
4.0000 mg | Freq: Once | INTRAMUSCULAR | Status: AC | PRN
  Administered 2022-02-13: 4 mg via INTRAVENOUS

## 2022-02-13 MED ORDER — CHLORHEXIDINE GLUCONATE 0.12 % MT SOLN
15.0000 mL | Freq: Once | OROMUCOSAL | Status: AC
  Administered 2022-02-13: 15 mL via OROMUCOSAL

## 2022-02-13 MED ORDER — OXYCODONE HCL 5 MG PO TABS: 5.0000 mg | ORAL_TABLET | ORAL | 0 refills | Status: DC | PRN

## 2022-02-13 MED ORDER — OXYCODONE HCL 5 MG PO TABS: 5.0000 mg | ORAL_TABLET | Freq: Once | ORAL | Status: DC | PRN

## 2022-02-13 MED ORDER — MIDAZOLAM HCL 2 MG/2ML IJ SOLN
INTRAMUSCULAR | Status: AC
  Administered 2022-02-13: 2 mg
  Filled 2022-02-13: qty 2

## 2022-02-13 MED ORDER — ORAL CARE MOUTH RINSE: 15.0000 mL | Freq: Once | OROMUCOSAL | Status: AC

## 2022-02-13 MED ORDER — ONDANSETRON HCL 4 MG/2ML IJ SOLN
INTRAMUSCULAR | Status: DC | PRN
  Administered 2022-02-13: 4 mg via INTRAVENOUS

## 2022-02-13 MED ORDER — BUPIVACAINE LIPOSOME 1.3 % IJ SUSP
INTRAMUSCULAR | Status: AC
  Filled 2022-02-13: qty 10

## 2022-02-13 MED ORDER — FENTANYL CITRATE (PF) 100 MCG/2ML IJ SOLN
INTRAMUSCULAR | Status: AC
  Filled 2022-02-13: qty 2

## 2022-02-13 MED ORDER — FAMOTIDINE 20 MG PO TABS
ORAL_TABLET | ORAL | Status: AC
  Administered 2022-02-13: 20 mg
  Filled 2022-02-13: qty 1

## 2022-02-13 MED ORDER — SUGAMMADEX SODIUM 200 MG/2ML IV SOLN
INTRAVENOUS | Status: DC | PRN
  Administered 2022-02-13: 200 mg via INTRAVENOUS

## 2022-02-13 MED ORDER — LIDOCAINE HCL (PF) 1 % IJ SOLN
INTRAMUSCULAR | Status: AC
  Filled 2022-02-13: qty 5

## 2022-02-13 MED ORDER — DROPERIDOL 2.5 MG/ML IJ SOLN
INTRAMUSCULAR | Status: AC
  Filled 2022-02-13: qty 2

## 2022-02-13 MED ORDER — NEOMYCIN-POLYMYXIN B GU 40-200000 IR SOLN
Status: AC
  Filled 2022-02-13: qty 20

## 2022-02-13 MED ORDER — MIDAZOLAM HCL 2 MG/2ML IJ SOLN: 1.0000 mg | Freq: Once | INTRAMUSCULAR | Status: DC

## 2022-02-13 MED ORDER — MIDAZOLAM HCL 2 MG/2ML IJ SOLN
INTRAMUSCULAR | Status: AC
  Filled 2022-02-13: qty 2

## 2022-02-13 MED ORDER — OXYCODONE HCL 5 MG/5ML PO SOLN: 5.0000 mg | Freq: Once | ORAL | Status: DC | PRN

## 2022-02-13 MED ORDER — ACETAMINOPHEN 10 MG/ML IV SOLN
INTRAVENOUS | Status: DC | PRN
  Administered 2022-02-13: 1000 mg via INTRAVENOUS

## 2022-02-13 MED ORDER — ONDANSETRON 4 MG PO TBDP: 4.0000 mg | ORAL_TABLET | Freq: Three times a day (TID) | ORAL | 0 refills | Status: DC | PRN

## 2022-02-13 MED ORDER — LACTATED RINGERS IV BOLUS
1000.0000 mL | Freq: Once | INTRAVENOUS | Status: AC
  Administered 2022-02-13: 1000 mL via INTRAVENOUS

## 2022-02-13 MED ORDER — FENTANYL CITRATE (PF) 100 MCG/2ML IJ SOLN
INTRAMUSCULAR | Status: DC | PRN
  Administered 2022-02-13 (×2): 25 ug via INTRAVENOUS

## 2022-02-13 MED ORDER — DEXAMETHASONE SODIUM PHOSPHATE 10 MG/ML IJ SOLN
INTRAMUSCULAR | Status: DC | PRN
  Administered 2022-02-13: 10 mg via INTRAVENOUS

## 2022-02-13 MED ORDER — THROMBIN 5000 UNITS EX SOLR
CUTANEOUS | Status: AC
  Filled 2022-02-13: qty 5000

## 2022-02-13 MED ORDER — ACETAMINOPHEN 10 MG/ML IV SOLN: 1000.0000 mg | Freq: Once | INTRAVENOUS | Status: DC | PRN

## 2022-02-13 MED ORDER — 0.9 % SODIUM CHLORIDE (POUR BTL) OPTIME
TOPICAL | Status: DC | PRN
  Administered 2022-02-13: 1000 mL

## 2022-02-13 MED ORDER — PHENYLEPHRINE HCL-NACL 20-0.9 MG/250ML-% IV SOLN
INTRAVENOUS | Status: DC | PRN
  Administered 2022-02-13 (×8): 80 ug via INTRAVENOUS
  Administered 2022-02-13: 40 ug/min via INTRAVENOUS

## 2022-02-13 MED ORDER — PHENYLEPHRINE HCL-NACL 20-0.9 MG/250ML-% IV SOLN
INTRAVENOUS | Status: AC
  Filled 2022-02-13: qty 250

## 2022-02-13 MED ORDER — MIDAZOLAM HCL 2 MG/2ML IJ SOLN
INTRAMUSCULAR | Status: DC | PRN
  Administered 2022-02-13: 2 mg via INTRAVENOUS

## 2022-02-13 MED ORDER — ACETAMINOPHEN 10 MG/ML IV SOLN
INTRAVENOUS | Status: AC
  Filled 2022-02-13: qty 100

## 2022-02-13 MED ORDER — BUPIVACAINE HCL (PF) 0.5 % IJ SOLN
INTRAMUSCULAR | Status: AC
  Filled 2022-02-13: qty 10

## 2022-02-13 MED ORDER — BUPIVACAINE LIPOSOME 1.3 % IJ SUSP
INTRAMUSCULAR | Status: DC | PRN
  Administered 2022-02-13: 10 mL

## 2022-02-13 MED ORDER — CEFAZOLIN SODIUM-DEXTROSE 2-4 GM/100ML-% IV SOLN
2.0000 g | INTRAVENOUS | Status: AC
  Administered 2022-02-13 (×2): 2 g via INTRAVENOUS

## 2022-02-13 MED ORDER — LACTATED RINGERS IV SOLN: INTRAVENOUS | Status: DC

## 2022-02-13 MED ORDER — BUPIVACAINE HCL (PF) 0.25 % IJ SOLN
INTRAMUSCULAR | Status: AC
  Filled 2022-02-13: qty 30

## 2022-02-13 MED ORDER — BUPIVACAINE HCL (PF) 0.5 % IJ SOLN
INTRAMUSCULAR | Status: DC | PRN
  Administered 2022-02-13: 10 mL

## 2022-02-13 MED ORDER — PROPOFOL 10 MG/ML IV BOLUS
INTRAVENOUS | Status: DC | PRN
  Administered 2022-02-13: 200 mg via INTRAVENOUS
  Administered 2022-02-13: 20 mg via INTRAVENOUS

## 2022-02-13 MED ORDER — CHLORHEXIDINE GLUCONATE 0.12 % MT SOLN
OROMUCOSAL | Status: AC
  Filled 2022-02-13: qty 15

## 2022-02-13 MED ORDER — EPINEPHRINE PF 1 MG/ML IJ SOLN
INTRAMUSCULAR | Status: AC
  Filled 2022-02-13: qty 1

## 2022-02-13 MED ORDER — LIDOCAINE HCL (PF) 2 % IJ SOLN
INTRAMUSCULAR | Status: AC
  Filled 2022-02-13: qty 5

## 2022-02-13 MED ORDER — VANCOMYCIN HCL 1000 MG IV SOLR
INTRAVENOUS | Status: AC
  Filled 2022-02-13: qty 20

## 2022-02-13 MED ORDER — LIDOCAINE HCL (PF) 1 % IJ SOLN
INTRAMUSCULAR | Status: DC | PRN
  Administered 2022-02-13: 3 mL

## 2022-02-13 MED ORDER — BUPIVACAINE LIPOSOME 1.3 % IJ SUSP
INTRAMUSCULAR | Status: AC
  Filled 2022-02-13: qty 20

## 2022-02-13 MED ORDER — ROCURONIUM BROMIDE 100 MG/10ML IV SOLN
INTRAVENOUS | Status: DC | PRN
  Administered 2022-02-13 (×5): 10 mg via INTRAVENOUS
  Administered 2022-02-13: 50 mg via INTRAVENOUS
  Administered 2022-02-13: 20 mg via INTRAVENOUS

## 2022-02-13 MED ORDER — CEFAZOLIN SODIUM-DEXTROSE 2-4 GM/100ML-% IV SOLN
INTRAVENOUS | Status: AC
  Filled 2022-02-13: qty 100

## 2022-02-13 MED ORDER — VANCOMYCIN HCL 1000 MG IV SOLR
INTRAVENOUS | Status: DC | PRN
  Administered 2022-02-13: 1000 mg via TOPICAL

## 2022-02-13 MED ORDER — FAMOTIDINE 20 MG PO TABS: 20.0000 mg | ORAL_TABLET | Freq: Once | ORAL | Status: DC

## 2022-02-13 MED ORDER — ASPIRIN 325 MG PO TBEC: 325.0000 mg | DELAYED_RELEASE_TABLET | Freq: Every day | ORAL | 0 refills | Status: AC

## 2022-02-13 SURGICAL SUPPLY — 85 items
ADH SKN CLS APL DERMABOND .7 (GAUZE/BANDAGES/DRESSINGS) ×1
APL PRP STRL LF DISP 70% ISPRP (MISCELLANEOUS) ×1
BIT DRILL 2.2 CANN STRGHT (BIT) IMPLANT
BIT DRILL 3 CANN STRGHT (BIT) ×1
BLADE SAGITTAL WIDE XTHICK NO (BLADE) ×1 IMPLANT
BLADE SURG 15 STRL LF DISP TIS (BLADE) ×1 IMPLANT
BLADE SURG 15 STRL SS (BLADE) ×1
BOWL CEMENT MIX W/ADAPTER (MISCELLANEOUS) IMPLANT
CHLORAPREP W/TINT 26 (MISCELLANEOUS) ×1 IMPLANT
CNTNR URN SCR LID CUP LEK RST (MISCELLANEOUS) ×1 IMPLANT
CONT SPEC 4OZ STRL OR WHT (MISCELLANEOUS)
COOLER POLAR GLACIER W/PUMP (MISCELLANEOUS) ×1 IMPLANT
COVER BACK TABLE REUSABLE LG (DRAPES) ×1 IMPLANT
DERMABOND ADVANCED .7 DNX12 (GAUZE/BANDAGES/DRESSINGS) ×2 IMPLANT
DRAPE 3/4 80X56 (DRAPES) ×2 IMPLANT
DRAPE C-ARM XRAY 36X54 (DRAPES) IMPLANT
DRAPE INCISE IOBAN 66X45 STRL (DRAPES) ×2 IMPLANT
DRAPE U-SHAPE 47X51 STRL (DRAPES) ×1 IMPLANT
DRILL COUNTERSINK CANN 3 (BIT) IMPLANT
DRSG OPSITE POSTOP 4X8 (GAUZE/BANDAGES/DRESSINGS) ×1 IMPLANT
DRSG TEGADERM 2-3/8X2-3/4 SM (GAUZE/BANDAGES/DRESSINGS) ×1 IMPLANT
ELECT REM PT RETURN 9FT ADLT (ELECTROSURGICAL) ×1
ELECTRODE REM PT RTRN 9FT ADLT (ELECTROSURGICAL) ×1 IMPLANT
GAUZE XEROFORM 1X8 LF (GAUZE/BANDAGES/DRESSINGS) ×1 IMPLANT
GLOVE BIO SURGEON STRL SZ7.5 (GLOVE) ×1 IMPLANT
GLOVE BIOGEL PI IND STRL 8 (GLOVE) ×2 IMPLANT
GLOVE SURG ORTHO 8.0 STRL STRW (GLOVE) ×1 IMPLANT
GOWN STRL REUS W/ TWL LRG LVL3 (GOWN DISPOSABLE) ×2 IMPLANT
GOWN STRL REUS W/ TWL XL LVL3 (GOWN DISPOSABLE) ×1 IMPLANT
GOWN STRL REUS W/TWL LRG LVL3 (GOWN DISPOSABLE) ×2
GOWN STRL REUS W/TWL XL LVL3 (GOWN DISPOSABLE) ×1
GUIDEWIRE .045XTROC TIP LSR LN (WIRE) IMPLANT
GUIDEWIRE DBLE END TROCAR 0.86 (WIRE) IMPLANT
GUIDEWIRE W/TRCR TIP 2.4X9.25 (WIRE) IMPLANT
HEMOSTAT SURGICEL 2X14 (HEMOSTASIS) IMPLANT
HEMOSTAT SURGICEL 2X3 (HEMOSTASIS) ×2 IMPLANT
HEMOVAC 400CC 10FR (MISCELLANEOUS) IMPLANT
HOLDER FOLEY CATH W/STRAP (MISCELLANEOUS) ×1 IMPLANT
HOOD PEEL AWAY T7 (MISCELLANEOUS) ×3 IMPLANT
IV NS IRRIG 3000ML ARTHROMATIC (IV SOLUTION) ×1 IMPLANT
K-WIRE 1.1 (WIRE) ×6
KIT STABILIZATION SHOULDER (MISCELLANEOUS) ×1 IMPLANT
KIT TURNOVER KIT A (KITS) ×1 IMPLANT
MANIFOLD NEPTUNE II (INSTRUMENTS) ×1 IMPLANT
MASK FACE SPIDER DISP (MASK) ×1 IMPLANT
MAT ABSORB  FLUID 56X50 GRAY (MISCELLANEOUS) ×1
MAT ABSORB FLUID 56X50 GRAY (MISCELLANEOUS) ×1 IMPLANT
NDL REVERSE CUT 1/2 CRC (NEEDLE) ×1 IMPLANT
NDL SAFETY ECLIP 18X1.5 (MISCELLANEOUS) ×1 IMPLANT
NDL SPNL 20GX3.5 QUINCKE YW (NEEDLE) ×1 IMPLANT
NEEDLE REVERSE CUT 1/2 CRC (NEEDLE) IMPLANT
NEEDLE SPNL 20GX3.5 QUINCKE YW (NEEDLE) ×1 IMPLANT
NS IRRIG 1000ML POUR BTL (IV SOLUTION) ×1 IMPLANT
PACK ARTHROSCOPY SHOULDER (MISCELLANEOUS) ×1 IMPLANT
PAD ARMBOARD 7.5X6 YLW CONV (MISCELLANEOUS) ×2 IMPLANT
PAD WRAPON POLAR SHDR UNIV (MISCELLANEOUS) ×1 IMPLANT
PULSAVAC PLUS IRRIG FAN TIP (DISPOSABLE)
SCREW CAN FT 3.5X30 (Screw) IMPLANT
SCREW CANN COMP FT 2.5X38 (Screw) IMPLANT
SCREW CANN FT 3.5X40 (Screw) IMPLANT
SCREW CANN FT SP 3.6X32 (Screw) IMPLANT
SLING ULTRA II LG (MISCELLANEOUS) IMPLANT
SLING ULTRA II M (MISCELLANEOUS) IMPLANT
SPONGE GAUZE 2X2 8PLY STRL LF (GAUZE/BANDAGES/DRESSINGS) ×1 IMPLANT
SPONGE T-LAP 18X18 ~~LOC~~+RFID (SPONGE) ×4 IMPLANT
STRAP SAFETY 5IN WIDE (MISCELLANEOUS) ×2 IMPLANT
SUT FIBERWIRE #2 38 BLUE 1/2 (SUTURE) ×1
SUT MNCRL AB 4-0 PS2 18 (SUTURE) IMPLANT
SUT TICRON 2-0 30IN 311381 (SUTURE) ×3 IMPLANT
SUT VIC AB 0 CT1 27 (SUTURE) ×1
SUT VIC AB 0 CT1 27XCR 8 STRN (SUTURE) ×1 IMPLANT
SUT VIC AB 0 CT1 36 (SUTURE) IMPLANT
SUT VIC AB 0 CT2 27 (SUTURE) IMPLANT
SUT VIC AB 2-0 CT2 27 (SUTURE) ×2 IMPLANT
SUTURE FIBERWR #2 38 BLUE 1/2 (SUTURE) ×4 IMPLANT
SYR 20ML LL LF (SYRINGE) ×1 IMPLANT
SYR 30ML LL (SYRINGE) ×2 IMPLANT
SYR 5ML LL (SYRINGE) ×1 IMPLANT
SYR TOOMEY IRRIG 70ML (MISCELLANEOUS)
SYRINGE TOOMEY IRRIG 70ML (MISCELLANEOUS) ×1 IMPLANT
TIP FAN IRRIG PULSAVAC PLUS (DISPOSABLE) ×1 IMPLANT
TRAP FLUID SMOKE EVACUATOR (MISCELLANEOUS) ×1 IMPLANT
TRAY FOLEY SLVR 16FR LF STAT (SET/KITS/TRAYS/PACK) ×1 IMPLANT
WATER STERILE IRR 500ML POUR (IV SOLUTION) ×1 IMPLANT
WRAPON POLAR PAD SHDR UNIV (MISCELLANEOUS) ×1

## 2022-02-13 NOTE — Op Note (Signed)
Operative Note    SURGERY DATE: 02/13/2022   PRE-OP DIAGNOSIS:  1.  Left anterior glenoid rim fracture   POST-OP DIAGNOSIS:  1.  Left anterior glenoid rim fracture    PROCEDURES:  1.  Left ORIF glenoid   SURGEON: Cato Mulligan, MD  ASSISTANTS: Reche Dixon, PA; Linward Natal, PA-S    ANESTHESIA: Gen + interscalene block   ESTIMATED BLOOD LOSS: 100cc   TOTAL IV FLUIDS: per anesthesia  IMPLANTS: - Arthrex headless compression screws x3 (3.5 mm and 2.5 mm) (glenoid fixation)    INDICATION(S):  Andre Robbins is a 52 y.o. male who had a fall going up the stairs.  Imaging in the emergency department showed an anterior glenoid rim fracture involving ~30% of the glenoid width. After discussion of risks, benefits, and alternatives to surgery, the patient elected to proceed.  The patient understands that he is at risk of developing future osteoarthritis as well as shoulder stiffness after surgery in addition to the standard risks associated with any other surgery.  OPERATIVE FINDINGS: 3+ glenohumeral anterior stability on exam under anesthesia. Intraoperatively, there were two large bony fragments of anterior glenoid rim   OPERATIVE REPORT:   I identified Andre Robbins in the pre-operative holding area. Informed consent was obtained and the surgical site was marked. I reviewed the risks and benefits of the proposed surgical intervention and the patient wished to proceed. An interscalene block with Exparel was administered by the Anesthesia team. The patient was transferred to the operative suite and general anesthesia was administered. The patient was placed in the beach chair position with the head of the bed elevated approximately 45 degrees. All down side pressure points were appropriately padded. Pre-op exam under anesthesia confirmed anterior shoulder instability. Appropriate IV antibiotics were administered within 30 minutes before incision. The extremity was then prepped and draped in standard  fashion. A time out was performed confirming the correct extremity, correct patient, and correct procedure.   We used the standard deltopectoral incision from the coracoid to ~8cm distal.  Vancomycin powder was placed.  We found the cephalic vein and took it laterally. We opened the deltopectoral interval widely and placed retractors under the CA ligament in the subacromial space and under the deltoid tendon at its insertion. We then abducted and internally rotated the arm and released the underlying bursa between these retractors, taking care not to damage the circumflex branch of the axillary nerve.   Next, we brought the arm back in adduction at slight forward flexion with external rotation. We opened the clavipectoral fascia lateral to the conjoint tendon. We gently palpated the axillary nerve and verified its position and continuity on both sides of the humerus with a Tug test. Note, this test was repeated multiple times during the procedure for nerve localization and confirmed to be intact at the end of the case. At this point we visualized the superior rotator cuff, and both the supraspinatus and infraspinatus tendons appeared intact.  Next, a subscapularis split was performed near the midportion of the subscapularis. This allowed for visualization of the anterior capsule. A vertical incision in the capsule was made at the level of the glenohemural joint..  A Fukuda retractor was used to retract the humeral head posteriorly and an anterior Bankart retractor was placed at the anterior/superior aspect of the glenoid.  There were 2 bony fragments. One fragment easily was reduced along the anterior/inferior glenoid. There was another fragment adherant to the anterior capsule that appeared to be from the anterior/superior  aspect of the glenoid. It was partially freed from the capsular attachment and mobilized with a freer elevator into the anticipated defect. K-wires were utilized to hold the fragments in  place. Reduction was confirmed by palpation and with fluoroscopy.  Next, the inferior K wire was overdrilled and a 3.5 mm Arthrex headless compression screw was placed.  Then, the superior K wire was overdrilled and a 2.5 mm Arthrex headless compression screw was placed.  There was room for a 3rd screw so another 3.19mm screw was placed in between the prior 2 screws in a similar fashion.  These 3 screws allowed for excellent, rigid fixation with appropriate reduction of the anterior glenoid fragments. The wound was thoroughly irrigated.  Next, the anterior capsule was slightly plicated and repaired with 0-vicryl. The tendinous portion of the subscapularis was also repaired with 0-Vicryl. We again verified the tension on the axillary nerve, appropriate range of motion, and stability of the shoulder. Vancomycin powder was placed again.  We closed the deltopectoral interval deep to the cephalic vein with a running, 0-Vicryl suture. The skin was closed with 2-0 Vicryl and 4-0 Monocryl with Dermabond. Honeycomb dressing was applied. Patient was extubated, transferred to the post anesthesia care unit in stable condition.   Of note, services of a PA were essential to performing the surgery. PA was able to assist in patient positioning, exposure, retraction, and suturing the wound.   POSTOPERATIVE PLAN: The patient will be discharged home. Operative arm to remain in sling at all times x 4 weeks except RoM exercises and hygiene. Can perform pendulums, elbow/wrist/hand RoM exercises. ASA 325mg  x 2 weeks for DVT ppx. Plan for outpatient PT starting on POD #3-4. Patient to return to clinic in ~2 weeks for post-operative appointment.

## 2022-02-13 NOTE — Transfer of Care (Signed)
Immediate Anesthesia Transfer of Care Note  Patient: Andre Robbins  Procedure(s) Performed: OPEN REDUCTION INTERNAL FIXATION (ORIF) SHOULDER FRACTURE (Left: Shoulder)  Patient Location: PACU  Anesthesia Type:General  Level of Consciousness: drowsy  Airway & Oxygen Therapy: Patient Spontanous Breathing and Patient connected to face mask oxygen  Post-op Assessment: Report given to RN and Post -op Vital signs reviewed and stable  Post vital signs: Reviewed and stable  Last Vitals:  Vitals Value Taken Time  BP 112/67 02/13/22 1600  Temp 36.7 C 02/13/22 1600  Pulse 80 02/13/22 1601  Resp 16 02/13/22 1601  SpO2 98 % 02/13/22 1601  Vitals shown include unvalidated device data.  Last Pain:  Vitals:   02/13/22 0929  TempSrc: Oral  PainSc: 0-No pain      Patients Stated Pain Goal: 0 (75/91/63 [id number])  Complications: No notable events documented.

## 2022-02-13 NOTE — Anesthesia Procedure Notes (Signed)
Anesthesia Regional Block: Interscalene brachial plexus block   Pre-Anesthetic Checklist: , timeout performed,  Correct Patient, Correct Site, Correct Laterality,  Correct Procedure, Correct Position, site marked,  Risks and benefits discussed,  Surgical consent,  Pre-op evaluation,  At surgeon's request and post-op pain management  Laterality: Left  Prep: alcohol swabs       Needles:  Injection technique: Single-shot  Needle Type: Echogenic Needle     Needle Length: 4cm  Needle Gauge: 25     Additional Needles:   Procedures:,,,, ultrasound used (permanent image in chart),,    Narrative:  Start time: 02/13/2022 10:51 AM End time: 02/13/2022 10:54 AM Injection made incrementally with aspirations every 5 mL.  Performed by: Personally  Anesthesiologist: Ilene Qua, MD  Additional Notes: Patient's chart reviewed and they were deemed appropriate candidate for procedure, at surgeon's request. Patient educated about risks, benefits, and alternatives of the block including but not limited to: temporary or permanent nerve damage, bleeding, infection, damage to surround tissues, pneumothorax, hemidiaphragmatic paralysis, unilateral Horner's syndrome, block failure, local anesthetic toxicity. Patient expressed understanding. A formal time-out was conducted consistent with institution rules.  Monitors were applied, and minimal sedation used (see nursing record). The site was prepped with skin prep and allowed to dry, and sterile gloves were used. A high frequency linear ultrasound probe with probe cover was utilized throughout. C5-7 nerve roots located and appeared anatomically normal, local anesthetic injected around them, and echogenic block needle trajectory was monitored throughout. Aspiration performed every 42ml. Lung and blood vessels were avoided. All injections were performed without resistance and free of blood and paresthesias. The patient tolerated the procedure well.  Injectate:  63ml exparel + 79ml 0.5% bupivacaine

## 2022-02-13 NOTE — Anesthesia Procedure Notes (Signed)
Procedure Name: Intubation Date/Time: 02/13/2022 11:16 AM  Performed by: Lily Peer, Bakary Bramer, CRNAPre-anesthesia Checklist: Patient identified, Emergency Drugs available, Suction available and Patient being monitored Patient Re-evaluated:Patient Re-evaluated prior to induction Oxygen Delivery Method: Circle system utilized Preoxygenation: Pre-oxygenation with 100% oxygen Induction Type: IV induction Ventilation: Mask ventilation without difficulty Laryngoscope Size: McGraph and 4 Grade View: Grade I Tube type: Oral Tube size: 7.0 mm Number of attempts: 1 Airway Equipment and Method: Stylet Placement Confirmation: ETT inserted through vocal cords under direct vision, positive ETCO2 and breath sounds checked- equal and bilateral Secured at: 21 cm Tube secured with: Tape Dental Injury: Teeth and Oropharynx as per pre-operative assessment

## 2022-02-13 NOTE — Discharge Instructions (Addendum)
Andre Mulligan, MD  [facility]  Phone: [phone]  Fax: [phone]   Discharge Instructions after Shoulder Surgery    1. Activity/Sling: You are to be non-weight bearing on operative extremity. A sling/shoulder immobilizer has been provided for you. Only remove the sling to perform the motion exercises (attached) and hygiene/dressing. Active reaching and lifting are not permitted. You will be given further instructions on sling use at your first physical therapy visit and postoperative visit with Dr. Posey Pronto.   2. Dressings: Dressing may be removed at 1st physical therapy visit (~3-4 days after surgery). Afterwards, you may either leave open to air (if no drainage) or cover with dry, sterile dressing. If you have steri-strips on your wound, please do not remove them. They will fall off on their own. You may shower 5 days after surgery. Please pat incision dry. Do not rub or place any shear forces across incision. If there is drainage or any opening of incision after 5 days, please notify our offices immediately.    3. Driving:  Plan on not driving for 4 weeks. Please note that you are advised NOT to drive while taking narcotic pain medications as you may be impaired and unsafe to drive.   4. Medications:  - You have been provided a prescription for narcotic pain medicine (usually oxycodone). After surgery, take 1-2 narcotic tablets every 4 hours if needed for severe pain. Please start this as soon as you begin to start having pain (if you received a nerve block, start taking as soon as this wears off).  - A prescription for anti-nausea medication will be provided in case the narcotic medicine causes nausea - take 1 tablet every 6 hours only if nauseated.  - Take enteric coated aspirin 325 mg once daily for 6 weeks to prevent blood clots. Do not take aspirin if you have an aspirin sensitivity/allergy or asthma or are on an anticoagulant (blood thinner) already. If so, then your home  anticoagulant will be resume and managed - do not take aspirin. -Take tylenol 1000mg  (2 Extra strength or 3 regular strength tablets) every 8 hours for pain. This will reduce the amount of narcotic medication needed. May stop tylenol when you are having minimal pain. - Take a stool softener (Colace, Dulcolax or Senakot) if you are using narcotic pain medications to help with constipation that is associated with narcotic use. - DO NOT take ANY nonsteroidal anti-inflammatory pain medications: Advil, Motrin, Ibuprofen, Aleve, Naproxen, or Naprosyn.   If you are taking prescription medication for anxiety, depression, insomnia, muscle spasm, chronic pain, or for attention deficit disorder you are advised that you are at a higher risk of adverse effects with use of narcotics post-op, including narcotic addiction/dependence, depressed breathing, death. If you use non-prescribed substances: alcohol, marijuana, cocaine, heroin, methamphetamines, etc., you are at a higher risk of adverse effects with use of narcotics post-op, including narcotic addiction/dependence, depressed breathing, death. You are advised that taking > 50 morphine milligram equivalents (MME) of narcotic pain medication per day results in twice the risk of overdose or death. For your prescription provided: oxycodone 5 mg - taking more than 6 tablets per day after the first few days of surgery.   5. Physical Therapy: 1-2 times per week for ~12 weeks. Therapy typically starts on post operative Day 3 or 4. You have been provided an order for physical therapy. The therapist will provide home exercises. Please contact our offices if this appointment has not been scheduled.    6.  Work: May do light duty/desk job in approximately 2 weeks when off of narcotics, pain is well-controlled, and swelling has decreased if able to function with one arm in sling. Full work may take 6 weeks if light motions and function of both arms is required. Lifting jobs may  require 12 weeks.   7. Post-Op Appointments: Your first post-op appointment will be in approximately 2 weeks time.    If you find that they have not been scheduled please call the Orthopaedic Appointment front desk at [phone].   AMBULATORY SURGERY  DISCHARGE INSTRUCTIONS   The drugs that you were given will stay in your system until tomorrow so for the next 24 hours you should not:  Drive an automobile Make any legal decisions Drink any alcoholic beverage   You may resume regular meals tomorrow.  Today it is better to start with liquids and gradually work up to solid foods.  You may eat anything you prefer, but it is better to start with liquids, then soup and crackers, and gradually work up to solid foods.   Please notify your doctor immediately if you have any unusual bleeding, trouble breathing, redness and pain at the surgery site, drainage, fever, or pain not relieved by medication.      Additional Instructions:   LEAVE GREEN ARM BAND ON FOR 4 DAYS   POLAR CARE INFORMATION  [email or web address].com/  How to use [facility] Cold Therapy System?  YouTube   BargainHeads.[email or web address]  OPERATING INSTRUCTIONS  Start the product With dry hands, connect the transformer to the electrical connection located on the top of the cooler. Next, plug the transformer into an appropriate electrical outlet. The unit will automatically start running at this point.  To stop the pump, disconnect electrical power.  Unplug to stop the product when not in use. Unplugging the Polar Care unit turns it off. Always unplug immediately after use. Never leave it plugged in while unattended. Remove pad.    FIRST ADD WATER TO FILL LINE, THEN ICE---Replace ice when existing ice is almost melted  1 Discuss Treatment with your [facility] Practitioner and Use Only as Prescribed 2 Apply Insulation Barrier & Cold Therapy Pad 3 Check for Moisture 4 Inspect Skin  Regularly  Tips and Trouble Shooting Usage Tips 1. Use cubed or chunked ice for optimal performance. 2. It is recommended to drain the Pad between uses. To drain the pad, hold the Pad upright with the hose pointed toward the ground. Depress the black plunger and allow water to drain out. 3. You may disconnect the Pad from the unit without removing the pad from the affected area by depressing the silver tabs on the hose coupling and gently pulling the hoses apart. The Pad and unit will seal itself and will not leak. Note: Some dripping during release is normal. 4. DO NOT RUN PUMP WITHOUT WATER! The pump in this unit is designed to run with water. Running the unit without water will cause permanent damage to the pump. 5. Unplug unit before removing lid.  TROUBLESHOOTING GUIDE Pump not running, Water not flowing to the pad, Pad is not getting cold 1. Make sure the transformer is plugged into the wall outlet. 2. Confirm that the ice and water are filled to the indicated levels. 3. Make sure there are no kinks in the pad. 4. Gently pull on the blue tube to make sure the tube/pad junction is straight. 5. Remove the pad from the treatment site and ll it while  the pad is lying at; then reapply. 6. Confirm that the pad couplings are securely attached to the unit. Listen for the double clicks (Figure 1) to confirm the pad couplings are securely attached.  Leaks    Note: Some condensation on the lines, controller, and pads is unavoidable, especially in warmer climates. 1. If using a Breg Polar Care Cold Therapy unit with a detachable Cold Therapy Pad, and a leak exists (other than condensation on the lines) disconnect the pad couplings. Make sure the silver tabs on the couplings are depressed before reconnecting the pad to the pump hose; then confirm both sides of the coupling are properly clicked in. 2. If the coupling continues to leak or a leak is detected in the pad itself, stop using it and call [facility] at [phone].  Cleaning After use, empty and dry the unit with a soft cloth. Warm water and mild detergent may be used occasionally to clean the pump and tubes.  WARNING: The Young can be cold enough to cause serious injury, including full skin necrosis. Follow these Operating Instructions, and carefully read the Product Insert (see pouch on side of unit) and the Cold Therapy Pad Fitting Instructions (provided with each Cold Therapy Pad) prior to use.  SHOULDER SLING IMMOBILIZER   VIDEO Slingshot 2 Shoulder Brace Application - YouTube ---[email or web address]-schwartz.[email or web address]/  INSTRUCTIONS While supporting the injured arm, slide the forearm into the sling. Wrap the adjustable shoulder strap around the neck and shoulders and attach the strap end to the sling using  the "alligator strap tab."  Adjust the shoulder strap to the required length. Position the shoulder pad behind the neck. To secure the shoulder pad location (optional), pull the shoulder strap away from the shoulder pad, unfold the hook material on the top of the pad, then press the shoulder strap back onto the hook material to secure the pad in place. Attach the closure strap across the open top of the sling. Position the strap so that it holds the arm securely in the sling. Next, attach the thumb strap to the open end of the sling between the thumb and fingers. After sling has been fit, it may be easily removed and reapplied using the quick release buckle on shoulder strap. If a neutral pillow or 15 abduction pillow is included, place the pillow at the waistline. Attach the sling to the pillow, lining up hook material on the pillow with the loop on sling. Adjust the waist strap to fit.  If waist strap is too long, cut it to fit. Use the small piece of double sided hook material (located on top of the pillow) to secure the strap end. Place the double sided hook material on the inside of the cut strap  end and secure it to the waist strap.     If no pillow is included, attach the waist strap to the sling and adjust to fit.    Washing Instructions: Straps and sling must be removed and cleaned regularly depending on your activity level and perspiration. Hand wash straps and sling in cold water with mild detergent, rinse, air dry        Interscalene Nerve Block with Exparel   For your surgery you have received an Interscalene Nerve Block with Exparel. Nerve Blocks affect many types of nerves, including nerves that control movement, pain and normal sensation.  You may experience feelings such as numbness, tingling, heaviness, weakness or the inability to move your arm or the  feeling or sensation that your arm has "fallen asleep". A nerve block with Exparel can last up to 5 days.  Usually the weakness wears off first.  The tingling and heaviness usually wear off next.  Finally you may start to notice pain.  Keep in mind that this may occur in any order.  Once a nerve block starts to wear off it is usually completely gone within 60 minutes. ISNB may cause mild shortness of breath, a hoarse voice, blurry vision, unequal pupils, or drooping of the face on the same side as the nerve block.  These symptoms will usually resolve with the numbness.  Very rarely the procedure itself can cause mild seizures. If needed, your surgeon will give you a prescription for pain medication.  It will take about 60 minutes for the oral pain medication to become fully effective.  So, it is recommended that you start taking this medication before the nerve block first begins to wear off, or when you first begin to feel discomfort. Take your pain medication only as prescribed.  Pain medication can cause sedation and decrease your breathing if you take more than you need for the level of pain that you have. Nausea is a common side effect of many pain medications.  You may want to eat something before taking your pain medicine to  prevent nausea. After an Interscalene nerve block, you cannot feel pain, pressure or extremes in temperature in the effected arm.  Because your arm is numb it is at an increased risk for injury.  To decrease the possibility of injury, please practice the following:  While you are awake change the position of your arm frequently to prevent too much pressure on any one area for prolonged periods of time.  If you have a cast or tight dressing, check the color or your fingers every couple of hours.  Call your surgeon with the appearance of any discoloration (white or blue). If you are given a sling to wear before you go home, please wear it  at all times until the block has completely worn off.  Do not get up at night without your sling. Please contact [facility]est[facility] or your surgeon if you do not begin to regain sensation after 7 days from the surgery.  Anesthesia may be contacted by calling the Same Day Surgery Department, Mon. through Fri., 6 am to 4 pm at [phone].   If you experience any other problems or concerns, please contact your surgeon's office. If you experience severe or prolonged shortness of breath go to the nearest emergency department.

## 2022-02-13 NOTE — Anesthesia Preprocedure Evaluation (Signed)
Anesthesia Evaluation  Patient identified by MRN, date of birth, ID band Patient awake    Reviewed: Allergy & Precautions, NPO status , Patient's Chart, lab work & pertinent test results  History of Anesthesia Complications Negative for: history of anesthetic complications  Airway Mallampati: I  TM Distance: >3 FB Neck ROM: full    Dental no notable dental hx.    Pulmonary neg pulmonary ROS, former smoker   Pulmonary exam normal        Cardiovascular negative cardio ROS Normal cardiovascular exam     Neuro/Psych  Neuromuscular disease negative neurological ROS  negative psych ROS   GI/Hepatic negative GI ROS, Neg liver ROS,,,  Endo/Other  negative endocrine ROS    Renal/GU negative Renal ROS  negative genitourinary   Musculoskeletal   Abdominal   Peds  Hematology negative hematology ROS (+)   Anesthesia Other Findings Past Medical History: No date: Allergy No date: Carpal tunnel syndrome No date: Herpes     Comment:  genital  Past Surgical History: No date: WISDOM TOOTH EXTRACTION  BMI    Body Mass Index: 19.78 kg/m      Reproductive/Obstetrics negative OB ROS                             Anesthesia Physical Anesthesia Plan  ASA: 2  Anesthesia Plan: General ETT   Post-op Pain Management: Regional block*, Ofirmev IV (intra-op)* and Toradol IV (intra-op)*   Induction: Intravenous  PONV Risk Score and Plan: 2 and Ondansetron, Dexamethasone, Midazolam and Treatment may vary due to age or medical condition  Airway Management Planned: Oral ETT  Additional Equipment:   Intra-op Plan:   Post-operative Plan: Extubation in OR  Informed Consent: I have reviewed the patients History and Physical, chart, labs and discussed the procedure including the risks, benefits and alternatives for the proposed anesthesia with the patient or authorized representative who has indicated  his/her understanding and acceptance.     Dental Advisory Given  Plan Discussed with: Anesthesiologist, CRNA and Surgeon  Anesthesia Plan Comments: (Patient consented for risks of anesthesia including but not limited to:  - adverse reactions to medications - damage to eyes, teeth, lips or other oral mucosa - nerve damage due to positioning  - sore throat or hoarseness - Damage to heart, brain, nerves, lungs, other parts of body or loss of life  Patient voiced understanding.)       Anesthesia Quick Evaluation

## 2022-02-13 NOTE — H&P (Addendum)
Paper H&P to be scanned into permanent record. H&P reviewed. No significant changes noted.  

## 2022-02-14 ENCOUNTER — Encounter: Payer: Self-pay | Admitting: Orthopedic Surgery

## 2022-02-14 NOTE — Anesthesia Postprocedure Evaluation (Signed)
Anesthesia Post Note  Patient: Andre Robbins  Procedure(s) Performed: OPEN REDUCTION INTERNAL FIXATION (ORIF) SHOULDER FRACTURE (Left: Shoulder)  Patient location during evaluation: PACU Anesthesia Type: General Level of consciousness: awake and alert Pain management: pain level controlled Vital Signs Assessment: post-procedure vital signs reviewed and stable Respiratory status: spontaneous breathing, nonlabored ventilation, respiratory function stable and patient connected to nasal cannula oxygen Cardiovascular status: blood pressure returned to baseline and stable Postop Assessment: no apparent nausea or vomiting Anesthetic complications: no   No notable events documented.   Last Vitals:  Vitals:   02/13/22 1731 02/13/22 1827  BP: 107/76 110/75  Pulse: 80 83  Resp: 16 16  Temp: 36.7 C   SpO2: 96% 96%    Last Pain:  Vitals:   02/13/22 1731  TempSrc: Temporal  PainSc: 0-No pain                 Arita Miss

## 2022-02-21 DIAGNOSIS — M25612 Stiffness of left shoulder, not elsewhere classified: Secondary | ICD-10-CM | POA: Diagnosis not present

## 2022-02-21 DIAGNOSIS — G8929 Other chronic pain: Secondary | ICD-10-CM | POA: Diagnosis not present

## 2022-02-21 DIAGNOSIS — M25512 Pain in left shoulder: Secondary | ICD-10-CM | POA: Diagnosis not present

## 2022-02-21 DIAGNOSIS — R531 Weakness: Secondary | ICD-10-CM | POA: Diagnosis not present

## 2022-02-26 DIAGNOSIS — Z8781 Personal history of (healed) traumatic fracture: Secondary | ICD-10-CM | POA: Diagnosis not present

## 2022-02-26 DIAGNOSIS — Z9889 Other specified postprocedural states: Secondary | ICD-10-CM | POA: Diagnosis not present

## 2022-02-28 DIAGNOSIS — G8929 Other chronic pain: Secondary | ICD-10-CM | POA: Diagnosis not present

## 2022-02-28 DIAGNOSIS — M25512 Pain in left shoulder: Secondary | ICD-10-CM | POA: Diagnosis not present

## 2022-02-28 DIAGNOSIS — R531 Weakness: Secondary | ICD-10-CM | POA: Diagnosis not present

## 2022-02-28 DIAGNOSIS — M25612 Stiffness of left shoulder, not elsewhere classified: Secondary | ICD-10-CM | POA: Diagnosis not present

## 2022-03-06 ENCOUNTER — Encounter: Payer: Self-pay | Admitting: Family Medicine

## 2022-03-06 DIAGNOSIS — E7800 Pure hypercholesterolemia, unspecified: Secondary | ICD-10-CM | POA: Insufficient documentation

## 2022-03-11 DIAGNOSIS — R531 Weakness: Secondary | ICD-10-CM | POA: Diagnosis not present

## 2022-03-11 DIAGNOSIS — M25512 Pain in left shoulder: Secondary | ICD-10-CM | POA: Diagnosis not present

## 2022-03-11 DIAGNOSIS — M25612 Stiffness of left shoulder, not elsewhere classified: Secondary | ICD-10-CM | POA: Diagnosis not present

## 2022-03-11 DIAGNOSIS — G8929 Other chronic pain: Secondary | ICD-10-CM | POA: Diagnosis not present

## 2022-03-18 DIAGNOSIS — M25612 Stiffness of left shoulder, not elsewhere classified: Secondary | ICD-10-CM | POA: Diagnosis not present

## 2022-03-18 DIAGNOSIS — R531 Weakness: Secondary | ICD-10-CM | POA: Diagnosis not present

## 2022-03-18 DIAGNOSIS — M25512 Pain in left shoulder: Secondary | ICD-10-CM | POA: Diagnosis not present

## 2022-03-18 DIAGNOSIS — G8929 Other chronic pain: Secondary | ICD-10-CM | POA: Diagnosis not present

## 2022-03-26 DIAGNOSIS — Z8781 Personal history of (healed) traumatic fracture: Secondary | ICD-10-CM | POA: Diagnosis not present

## 2022-03-26 DIAGNOSIS — M4802 Spinal stenosis, cervical region: Secondary | ICD-10-CM | POA: Diagnosis not present

## 2022-03-26 DIAGNOSIS — Z9889 Other specified postprocedural states: Secondary | ICD-10-CM | POA: Diagnosis not present

## 2022-03-27 ENCOUNTER — Other Ambulatory Visit: Payer: Self-pay | Admitting: Orthopedic Surgery

## 2022-03-27 DIAGNOSIS — M4802 Spinal stenosis, cervical region: Secondary | ICD-10-CM

## 2022-04-03 DIAGNOSIS — M25612 Stiffness of left shoulder, not elsewhere classified: Secondary | ICD-10-CM | POA: Diagnosis not present

## 2022-04-03 DIAGNOSIS — G8929 Other chronic pain: Secondary | ICD-10-CM | POA: Diagnosis not present

## 2022-04-03 DIAGNOSIS — R531 Weakness: Secondary | ICD-10-CM | POA: Diagnosis not present

## 2022-04-03 DIAGNOSIS — M25512 Pain in left shoulder: Secondary | ICD-10-CM | POA: Diagnosis not present

## 2022-04-10 DIAGNOSIS — R531 Weakness: Secondary | ICD-10-CM | POA: Diagnosis not present

## 2022-04-14 ENCOUNTER — Ambulatory Visit
Admission: RE | Admit: 2022-04-14 | Discharge: 2022-04-14 | Disposition: A | Discharge status: Home / Self Care | Payer: BC Managed Care – PPO | Source: Ambulatory Visit | Attending: Orthopedic Surgery | Admitting: Orthopedic Surgery

## 2022-04-14 DIAGNOSIS — M4802 Spinal stenosis, cervical region: Secondary | ICD-10-CM

## 2022-04-14 DIAGNOSIS — R200 Anesthesia of skin: Secondary | ICD-10-CM | POA: Diagnosis not present

## 2022-04-17 DIAGNOSIS — R531 Weakness: Secondary | ICD-10-CM | POA: Diagnosis not present

## 2022-04-23 DIAGNOSIS — Z9889 Other specified postprocedural states: Secondary | ICD-10-CM | POA: Diagnosis not present

## 2022-04-23 DIAGNOSIS — Z8781 Personal history of (healed) traumatic fracture: Secondary | ICD-10-CM | POA: Diagnosis not present

## 2022-04-24 DIAGNOSIS — G8929 Other chronic pain: Secondary | ICD-10-CM | POA: Diagnosis not present

## 2022-04-24 DIAGNOSIS — M25512 Pain in left shoulder: Secondary | ICD-10-CM | POA: Diagnosis not present

## 2022-04-24 DIAGNOSIS — M25612 Stiffness of left shoulder, not elsewhere classified: Secondary | ICD-10-CM | POA: Diagnosis not present

## 2022-04-24 DIAGNOSIS — R531 Weakness: Secondary | ICD-10-CM | POA: Diagnosis not present

## 2022-04-28 DIAGNOSIS — M25612 Stiffness of left shoulder, not elsewhere classified: Secondary | ICD-10-CM | POA: Diagnosis not present

## 2022-04-28 DIAGNOSIS — R531 Weakness: Secondary | ICD-10-CM | POA: Diagnosis not present

## 2022-04-28 DIAGNOSIS — G8929 Other chronic pain: Secondary | ICD-10-CM | POA: Diagnosis not present

## 2022-04-28 DIAGNOSIS — M25512 Pain in left shoulder: Secondary | ICD-10-CM | POA: Diagnosis not present

## 2022-05-01 DIAGNOSIS — R531 Weakness: Secondary | ICD-10-CM | POA: Diagnosis not present

## 2022-05-05 DIAGNOSIS — M25612 Stiffness of left shoulder, not elsewhere classified: Secondary | ICD-10-CM | POA: Diagnosis not present

## 2022-05-05 DIAGNOSIS — R531 Weakness: Secondary | ICD-10-CM | POA: Diagnosis not present

## 2022-05-05 DIAGNOSIS — G8929 Other chronic pain: Secondary | ICD-10-CM | POA: Diagnosis not present

## 2022-05-05 DIAGNOSIS — M25512 Pain in left shoulder: Secondary | ICD-10-CM | POA: Diagnosis not present

## 2022-05-08 DIAGNOSIS — M25612 Stiffness of left shoulder, not elsewhere classified: Secondary | ICD-10-CM | POA: Diagnosis not present

## 2022-05-08 DIAGNOSIS — M25512 Pain in left shoulder: Secondary | ICD-10-CM | POA: Diagnosis not present

## 2022-05-08 DIAGNOSIS — R531 Weakness: Secondary | ICD-10-CM | POA: Diagnosis not present

## 2022-05-08 DIAGNOSIS — G8929 Other chronic pain: Secondary | ICD-10-CM | POA: Diagnosis not present

## 2022-05-09 ENCOUNTER — Telehealth: Payer: Self-pay | Admitting: Neurosurgery

## 2022-05-09 NOTE — Telephone Encounter (Signed)
-----   Message from Rockey Situ sent at 05/06/2022  9:34 AM EDT ----- Regarding: RE: C6 Left message to call back ----- Message ----- From: Venetia Night, MD Sent: 05/05/2022   4:25 PM EDT To: Signa Kell, MD; Rockey Situ Subject: RE: C6                                         Would be happy to work him into clinic. Does he have any weakness? Has he done any postop PT?  Patty - ok to schedule with me or with a PA if it is sooner. ----- Message ----- From: Signa Kell, MD Sent: 05/05/2022   4:24 PM EDT To: Venetia Night, MD Subject: C6                                             This is a patient of mine who had a glenoid fracture in his shoulder that was fixed 02/13/22. Postop, he's had really odd spasms and pain about the biceps, that eventually became numbness and painful tingling in radial forearm/thumb. MRI showed a herniated disk at C5-6 which would likely explain some of this. I had him referred to [facility], but by the time that happened, he said he was feeling somewhat better. Now it's back and he's hesitant to get injections. He wanted to talk about what decompression would involve and whether he'd be a candidate. Would you be able to schedule a clinic visit with him? His imaging is in the [facility] system. Thanks. Learta Codding

## 2022-05-09 NOTE — Telephone Encounter (Signed)
Patient has been contacted 2 times with out success, message has been left both times to call the office.

## 2022-05-12 DIAGNOSIS — R531 Weakness: Secondary | ICD-10-CM | POA: Diagnosis not present

## 2022-05-12 DIAGNOSIS — M25612 Stiffness of left shoulder, not elsewhere classified: Secondary | ICD-10-CM | POA: Diagnosis not present

## 2022-05-12 DIAGNOSIS — G8929 Other chronic pain: Secondary | ICD-10-CM | POA: Diagnosis not present

## 2022-05-12 DIAGNOSIS — M25512 Pain in left shoulder: Secondary | ICD-10-CM | POA: Diagnosis not present

## 2022-05-19 DIAGNOSIS — G8929 Other chronic pain: Secondary | ICD-10-CM | POA: Diagnosis not present

## 2022-05-19 DIAGNOSIS — M25612 Stiffness of left shoulder, not elsewhere classified: Secondary | ICD-10-CM | POA: Diagnosis not present

## 2022-05-19 DIAGNOSIS — M25512 Pain in left shoulder: Secondary | ICD-10-CM | POA: Diagnosis not present

## 2022-05-19 DIAGNOSIS — R531 Weakness: Secondary | ICD-10-CM | POA: Diagnosis not present

## 2022-05-21 DIAGNOSIS — S42142D Displaced fracture of glenoid cavity of scapula, left shoulder, subsequent encounter for fracture with routine healing: Secondary | ICD-10-CM | POA: Diagnosis not present

## 2022-05-23 DIAGNOSIS — R531 Weakness: Secondary | ICD-10-CM | POA: Diagnosis not present

## 2022-05-26 DIAGNOSIS — G8929 Other chronic pain: Secondary | ICD-10-CM | POA: Diagnosis not present

## 2022-05-26 DIAGNOSIS — R531 Weakness: Secondary | ICD-10-CM | POA: Diagnosis not present

## 2022-05-26 DIAGNOSIS — M25612 Stiffness of left shoulder, not elsewhere classified: Secondary | ICD-10-CM | POA: Diagnosis not present

## 2022-05-26 DIAGNOSIS — M25512 Pain in left shoulder: Secondary | ICD-10-CM | POA: Diagnosis not present

## 2022-05-28 DIAGNOSIS — G8929 Other chronic pain: Secondary | ICD-10-CM | POA: Diagnosis not present

## 2022-05-28 DIAGNOSIS — M25612 Stiffness of left shoulder, not elsewhere classified: Secondary | ICD-10-CM | POA: Diagnosis not present

## 2022-05-28 DIAGNOSIS — R531 Weakness: Secondary | ICD-10-CM | POA: Diagnosis not present

## 2022-05-28 DIAGNOSIS — M25512 Pain in left shoulder: Secondary | ICD-10-CM | POA: Diagnosis not present

## 2022-05-30 NOTE — Progress Notes (Unsigned)
Referring Physician:  Smitty Cords, DO [address] [place],  [place] [address]  Primary Physician:  Smitty Cords, DO  History of Present Illness: 06/03/2022 [name] is here today with a chief complaint of left arm pain and numbness.  He suffered a traumatic left shoulder injury in February 2024 when he fell up the stairs and slipped his left arm in between 2 stairs.  He fell downwards which caused a forced stretch on his left arm and a possible dislocation.  It is difficult for him to tell when his numbness began, but he began having some numbness and discomfort in his left forearm extending down to his first and second fingers.  He also has some numbness in the upper part of his shoulder.  This may have gotten slightly better.  His wife is done some massage, which has helped.  He has had some relief of his arm discomfort when his wife perform some manual traction procedures.  His discomfort is mostly difficult at nighttime.  He has been doing physical therapy for his shoulder but not for his neck.  Although his shoulder is better, he still has significant limitations in range of motion.  Bowel/Bladder Dysfunction: none  Conservative measures:  Physical therapy: still   participated in for his left shoulder, but not for his neck at [facility].  Multimodal medical therapy including regular antiinflammatories:  tylenol, oxycodone,  Injections: denies   Past Surgery: 02/13/2022  Andre Robbins has no symptoms of cervical myelopathy.  The symptoms are causing a significant impact on the patient's life.   I have utilized the care everywhere function in epic to review the outside records available from external health systems.  Review of Systems:  A 10 point review of systems is negative, except for the pertinent positives and negatives detailed in the HPI.  Past Medical History: Past Medical History:  Diagnosis Date   Allergy    Carpal tunnel  syndrome    Herpes    genital    Past Surgical History: Past Surgical History:  Procedure Laterality Date   ORIF SHOULDER FRACTURE Left 02/13/2022   Procedure: OPEN REDUCTION INTERNAL FIXATION (ORIF) SHOULDER FRACTURE;  Surgeon: Signa Kell, MD;  Location: [facility] ORS;  Service: Orthopedics;  Laterality: Left;   WISDOM TOOTH EXTRACTION      Allergies: Allergies as of 06/03/2022   (No Known Allergies)    Medications:  Current Outpatient Medications:    acetaminophen (TYLENOL) 500 MG tablet, Take 2 tablets (1,000 mg total) by mouth every 6 (six) hours as needed., Disp: 100 tablet, Rfl: 2   ibuprofen (ADVIL) 100 MG/5ML suspension, Take 200 mg by mouth every 4 (four) hours as needed., Disp: , Rfl:    saw palmetto 80 MG capsule, Take 1-2 capsules (80-160 mg total) by mouth 2 (two) times daily., Disp: , Rfl:    valACYclovir (VALTREX) 500 MG tablet, Take 1 tablet (500 mg total) by mouth 2 (two) times daily as needed. For 3 days per HSV flare as needed., Disp: 30 tablet, Rfl: 0  Social History: Social History   Tobacco Use   Smoking status: Former    Packs/day: 0.50    Years: 25.00    Additional pack years: 0.00    Total pack years: 12.50    Types: Cigarettes    Quit date: 10/06/2013    Years since quitting: 8.6   Smokeless tobacco: Never  Vaping Use   Vaping Use: Never used  Substance Use Topics  Alcohol use: Yes    Alcohol/week: 6.0 standard drinks of alcohol    Types: 6 Glasses of wine per week   Drug use: No    Family Medical History: Family History  Problem Relation Age of Onset   Kidney nephrosis Mother    Depression Mother    Depression Father    Colon cancer Paternal Grandfather 55   Prostate cancer Neg Hx     Physical Examination: Vitals:   06/03/22 0820  BP: 122/76    General: Patient is in no apparent distress. Attention to examination is appropriate.  Neck:   Supple.  Full range of motion without pain.  Respiratory: Patient is breathing without any  difficulty.   NEUROLOGICAL:     Awake, alert, oriented to person, place, and time.  Speech is clear and fluent.   Cranial Nerves: Pupils equal round and reactive to light.  Facial tone is symmetric.  Facial sensation is symmetric. Shoulder shrug is symmetric. Tongue protrusion is midline.  There is no pronator drift.  Strength: Side Biceps Triceps Deltoid Interossei Grip Wrist Ext. Wrist Flex.  R 5 5 5 5 5 5 5   L 5 5 4 5 5 5 5    Side Iliopsoas Quads Hamstring PF DF EHL  R 5 5 5 5 5 5   L 5 5 5 5 5 5    Reflexes are 1+ and symmetric at the biceps, triceps, brachioradialis, patella and achilles.   Hoffman's is absent.   Bilateral upper and lower extremity sensation is intact to light touch.   He has some hypersensitivity to touch in L forarm and thumb/index finger.  He has some numbness around his L shoulder on his upper arm. No evidence of dysmetria noted.  Gait is normal.     Medical Decision Making  Imaging: MRI C spine 04/14/2022 IMPRESSION: 1. Left paracentral disc protrusion at C5-6 with resulting mild spinal stenosis and mild ventral indentation on the left side of the cord. Uncinate spurring contributes to asymmetric mild to moderate left foraminal narrowing and possible left C6 nerve root encroachment. 2. Shallow right paracentral disc protrusion at C6-7 without cord deformity or significant foraminal narrowing. 3. Small central disc protrusion at C7-T1 without cord deformity or foraminal narrowing.     Electronically Signed   By: Carey Bullocks M.D.   On: 04/14/2022 10:54    I have personally reviewed the images and agree with the above interpretation.  Assessment and Plan: Andre Robbins is a pleasant 52 y.o. male with neuropathic discomfort with hypersensitivity in his left arm in a C6 distribution as well as some additional numbness and discomfort around his left shoulder.  Given the mechanism of injury, I think there is some concern that this may involve a  brachial plexus stretch injury at the time of his trauma.  I think that physical therapy may help him, as well as nighttime gabapentin dosing which may dull his discomfort.  To help elucidate the condition further, I have recommended a nerve conduction study of his left upper extremity.  I spent a total of 30 minutes in this patient's care today. This time was spent reviewing pertinent records including imaging studies, obtaining and confirming history, performing a directed evaluation, formulating and discussing my recommendations, and documenting the visit within the medical record.    Thank you for involving me in the care of this patient.      Channell Quattrone K. Myer Haff MD, [facility]

## 2022-06-03 ENCOUNTER — Ambulatory Visit: Payer: BC Managed Care – PPO | Admitting: Neurosurgery

## 2022-06-03 ENCOUNTER — Telehealth: Payer: Self-pay

## 2022-06-03 ENCOUNTER — Encounter: Payer: Self-pay | Admitting: Neurosurgery

## 2022-06-03 VITALS — BP 122/76 | Ht 73.0 in | Wt 146.4 lb

## 2022-06-03 DIAGNOSIS — M5412 Radiculopathy, cervical region: Secondary | ICD-10-CM

## 2022-06-03 DIAGNOSIS — Z8781 Personal history of (healed) traumatic fracture: Secondary | ICD-10-CM

## 2022-06-03 DIAGNOSIS — G540 Brachial plexus disorders: Secondary | ICD-10-CM

## 2022-06-03 NOTE — Telephone Encounter (Signed)
Dr. Myer Haff has ordered a EMG on this patient. Please send the referral.

## 2022-06-04 DIAGNOSIS — M25512 Pain in left shoulder: Secondary | ICD-10-CM | POA: Diagnosis not present

## 2022-06-04 DIAGNOSIS — M25612 Stiffness of left shoulder, not elsewhere classified: Secondary | ICD-10-CM | POA: Diagnosis not present

## 2022-06-04 DIAGNOSIS — R531 Weakness: Secondary | ICD-10-CM | POA: Diagnosis not present

## 2022-06-04 DIAGNOSIS — G8929 Other chronic pain: Secondary | ICD-10-CM | POA: Diagnosis not present

## 2022-06-04 NOTE — Telephone Encounter (Signed)
Referral faxed to [facility] on 06/03/2022.

## 2022-06-05 ENCOUNTER — Encounter: Payer: Self-pay | Admitting: Neurosurgery

## 2022-06-06 DIAGNOSIS — M25612 Stiffness of left shoulder, not elsewhere classified: Secondary | ICD-10-CM | POA: Diagnosis not present

## 2022-06-06 DIAGNOSIS — R531 Weakness: Secondary | ICD-10-CM | POA: Diagnosis not present

## 2022-06-06 DIAGNOSIS — M25512 Pain in left shoulder: Secondary | ICD-10-CM | POA: Diagnosis not present

## 2022-06-06 DIAGNOSIS — G8929 Other chronic pain: Secondary | ICD-10-CM | POA: Diagnosis not present

## 2022-06-10 DIAGNOSIS — M25512 Pain in left shoulder: Secondary | ICD-10-CM | POA: Diagnosis not present

## 2022-06-10 DIAGNOSIS — M25612 Stiffness of left shoulder, not elsewhere classified: Secondary | ICD-10-CM | POA: Diagnosis not present

## 2022-06-10 DIAGNOSIS — R531 Weakness: Secondary | ICD-10-CM | POA: Diagnosis not present

## 2022-06-10 DIAGNOSIS — G8929 Other chronic pain: Secondary | ICD-10-CM | POA: Diagnosis not present

## 2022-06-11 NOTE — Telephone Encounter (Signed)
Per Epic, patient was contacted on 06/03/2022 to return call for appt. No appt has been made yet with neurology.

## 2022-06-18 NOTE — Telephone Encounter (Signed)
No appointment has been scheduled yet

## 2022-06-23 DIAGNOSIS — R531 Weakness: Secondary | ICD-10-CM | POA: Diagnosis not present

## 2022-06-23 DIAGNOSIS — M5412 Radiculopathy, cervical region: Secondary | ICD-10-CM | POA: Diagnosis not present

## 2022-06-23 DIAGNOSIS — M25512 Pain in left shoulder: Secondary | ICD-10-CM | POA: Diagnosis not present

## 2022-06-23 DIAGNOSIS — M25612 Stiffness of left shoulder, not elsewhere classified: Secondary | ICD-10-CM | POA: Diagnosis not present

## 2022-06-25 DIAGNOSIS — M25512 Pain in left shoulder: Secondary | ICD-10-CM | POA: Diagnosis not present

## 2022-06-25 DIAGNOSIS — M5412 Radiculopathy, cervical region: Secondary | ICD-10-CM | POA: Diagnosis not present

## 2022-06-25 DIAGNOSIS — R531 Weakness: Secondary | ICD-10-CM | POA: Diagnosis not present

## 2022-06-25 DIAGNOSIS — M25612 Stiffness of left shoulder, not elsewhere classified: Secondary | ICD-10-CM | POA: Diagnosis not present

## 2022-06-30 DIAGNOSIS — M25612 Stiffness of left shoulder, not elsewhere classified: Secondary | ICD-10-CM | POA: Diagnosis not present

## 2022-06-30 DIAGNOSIS — M5412 Radiculopathy, cervical region: Secondary | ICD-10-CM | POA: Diagnosis not present

## 2022-06-30 DIAGNOSIS — M25512 Pain in left shoulder: Secondary | ICD-10-CM | POA: Diagnosis not present

## 2022-06-30 DIAGNOSIS — R531 Weakness: Secondary | ICD-10-CM | POA: Diagnosis not present

## 2022-07-02 DIAGNOSIS — M25612 Stiffness of left shoulder, not elsewhere classified: Secondary | ICD-10-CM | POA: Diagnosis not present

## 2022-07-02 DIAGNOSIS — S42142D Displaced fracture of glenoid cavity of scapula, left shoulder, subsequent encounter for fracture with routine healing: Secondary | ICD-10-CM | POA: Diagnosis not present

## 2022-07-02 DIAGNOSIS — M25512 Pain in left shoulder: Secondary | ICD-10-CM | POA: Diagnosis not present

## 2022-07-02 DIAGNOSIS — M5412 Radiculopathy, cervical region: Secondary | ICD-10-CM | POA: Diagnosis not present

## 2022-07-02 DIAGNOSIS — R531 Weakness: Secondary | ICD-10-CM | POA: Diagnosis not present

## 2022-07-07 DIAGNOSIS — M25612 Stiffness of left shoulder, not elsewhere classified: Secondary | ICD-10-CM | POA: Diagnosis not present

## 2022-07-07 DIAGNOSIS — M25512 Pain in left shoulder: Secondary | ICD-10-CM | POA: Diagnosis not present

## 2022-07-07 DIAGNOSIS — M5412 Radiculopathy, cervical region: Secondary | ICD-10-CM | POA: Diagnosis not present

## 2022-07-07 DIAGNOSIS — R531 Weakness: Secondary | ICD-10-CM | POA: Diagnosis not present

## 2022-07-09 DIAGNOSIS — R531 Weakness: Secondary | ICD-10-CM | POA: Diagnosis not present

## 2022-07-09 DIAGNOSIS — M5412 Radiculopathy, cervical region: Secondary | ICD-10-CM | POA: Diagnosis not present

## 2022-07-09 DIAGNOSIS — M25512 Pain in left shoulder: Secondary | ICD-10-CM | POA: Diagnosis not present

## 2022-07-09 DIAGNOSIS — M25612 Stiffness of left shoulder, not elsewhere classified: Secondary | ICD-10-CM | POA: Diagnosis not present

## 2022-07-17 LAB — LIPID PANEL
Cholesterol: 210 — AB (ref 0–200)
HDL: 78 — AB (ref 35–70)
LDL Cholesterol: 119
Triglycerides: 75 (ref 40–160)

## 2022-07-17 LAB — HEMOGLOBIN A1C: Hemoglobin A1C: 5.6

## 2022-07-17 LAB — BASIC METABOLIC PANEL
Creatinine: 1.2 (ref 0.6–1.3)
Glucose: 89

## 2022-07-17 LAB — COMPREHENSIVE METABOLIC PANEL: eGFR: 72

## 2022-07-22 DIAGNOSIS — M25512 Pain in left shoulder: Secondary | ICD-10-CM | POA: Diagnosis not present

## 2022-07-22 DIAGNOSIS — M5412 Radiculopathy, cervical region: Secondary | ICD-10-CM | POA: Diagnosis not present

## 2022-07-22 DIAGNOSIS — M25612 Stiffness of left shoulder, not elsewhere classified: Secondary | ICD-10-CM | POA: Diagnosis not present

## 2022-07-22 DIAGNOSIS — R531 Weakness: Secondary | ICD-10-CM | POA: Diagnosis not present

## 2022-07-29 DIAGNOSIS — R531 Weakness: Secondary | ICD-10-CM | POA: Diagnosis not present

## 2022-07-29 DIAGNOSIS — M25612 Stiffness of left shoulder, not elsewhere classified: Secondary | ICD-10-CM | POA: Diagnosis not present

## 2022-07-29 DIAGNOSIS — M25512 Pain in left shoulder: Secondary | ICD-10-CM | POA: Diagnosis not present

## 2022-07-29 DIAGNOSIS — G8929 Other chronic pain: Secondary | ICD-10-CM | POA: Diagnosis not present

## 2022-08-05 DIAGNOSIS — M25612 Stiffness of left shoulder, not elsewhere classified: Secondary | ICD-10-CM | POA: Diagnosis not present

## 2022-08-13 DIAGNOSIS — R531 Weakness: Secondary | ICD-10-CM | POA: Diagnosis not present

## 2022-08-13 DIAGNOSIS — M5412 Radiculopathy, cervical region: Secondary | ICD-10-CM | POA: Diagnosis not present

## 2022-08-13 DIAGNOSIS — M25512 Pain in left shoulder: Secondary | ICD-10-CM | POA: Diagnosis not present

## 2022-08-13 DIAGNOSIS — M25612 Stiffness of left shoulder, not elsewhere classified: Secondary | ICD-10-CM | POA: Diagnosis not present

## 2022-10-08 DIAGNOSIS — S42142D Displaced fracture of glenoid cavity of scapula, left shoulder, subsequent encounter for fracture with routine healing: Secondary | ICD-10-CM | POA: Diagnosis not present

## 2022-10-09 ENCOUNTER — Encounter: Payer: Self-pay | Admitting: Family Medicine

## 2022-10-09 ENCOUNTER — Ambulatory Visit: Payer: BC Managed Care – PPO | Admitting: Family Medicine

## 2022-10-09 VITALS — BP 126/86 | HR 71 | Wt 146.0 lb

## 2022-10-09 DIAGNOSIS — Z125 Encounter for screening for malignant neoplasm of prostate: Secondary | ICD-10-CM

## 2022-10-09 DIAGNOSIS — N401 Enlarged prostate with lower urinary tract symptoms: Secondary | ICD-10-CM | POA: Diagnosis not present

## 2022-10-09 DIAGNOSIS — R41840 Attention and concentration deficit: Secondary | ICD-10-CM

## 2022-10-09 DIAGNOSIS — F411 Generalized anxiety disorder: Secondary | ICD-10-CM | POA: Diagnosis not present

## 2022-10-09 DIAGNOSIS — Z23 Encounter for immunization: Secondary | ICD-10-CM

## 2022-10-09 DIAGNOSIS — E7800 Pure hypercholesterolemia, unspecified: Secondary | ICD-10-CM

## 2022-10-09 DIAGNOSIS — A6009 Herpesviral infection of other urogenital tract: Secondary | ICD-10-CM

## 2022-10-09 DIAGNOSIS — N138 Other obstructive and reflux uropathy: Secondary | ICD-10-CM

## 2022-10-09 DIAGNOSIS — Z131 Encounter for screening for diabetes mellitus: Secondary | ICD-10-CM

## 2022-10-09 MED ORDER — VALACYCLOVIR HCL 500 MG PO TABS: 500.0000 mg | ORAL_TABLET | Freq: Two times a day (BID) | ORAL | 0 refills | Status: AC | PRN

## 2022-10-09 MED ORDER — ESCITALOPRAM OXALATE 10 MG PO TABS: 10.0000 mg | ORAL_TABLET | Freq: Every day | ORAL | 1 refills | Status: DC

## 2022-10-09 NOTE — Patient Instructions (Addendum)
Thank you for coming to the office today.  [facility] labs printed. Please send me the results from [facility]  Start Escitalopram 10mg  daily for anxiety, may take 3-4 weeks for full benefits and it can continue to work later. Goal to take for 6-12 months and re-assess. Caution upset stomach, erectile dysfunction  We can consider add Buspar in future.  Let me know if questions.   These offices have both PSYCHIATRY doctors and THERAPISTS  [facility] (Virtual Available) [place] [address] [place], [place] [address] Phone: [phone]  [facility] Address: [address], [place], [place] [address] [email or web address] Phone: [phone]  [facility]) Address: [address], [place], [place] [address] Hours: 8:30AM-5PM Phone: [phone]  [facility] (Adult, Peds, Geriatric, Counseling) [address] [place], [place] [address] Phone: [phone] Fax: [phone]  [facility] [address] [place], [place] [address] Phone: [phone]  [facility] (All ages) [address] [place] [place], [id number] Phone: [phone] (Option 1) [email or web address]  ----------------------------------------------------------------- THERAPIST ONLY  (No Psychiatry)  [facility] [address] [place], [place] [address] [place] P: [phone]  [facility]) [facility], [facility] [address] [place], [place] [address] [phone]  [facility]   Address: [address] [place], [place] [address] Hours: Open today  9AM-7PM Phone: [phone]  [facility] [address] [facility]  - [facility] Address: [address] [place], [place] [address] Phone: [phone]  [facility] [place], [place] [address] Phone: [phone]   Please schedule a Follow-up Appointment to: Return in about 3 months (around 01/09/2023) for 3 month Anxiety follow-up.  If you have any other questions or concerns, please feel free to call the office or send a message through MyChart. You may also schedule an earlier appointment if necessary.  Additionally, you may be receiving a survey about your experience at our office within a few days to 1 week by e-mail or mail. We value your feedback.  Saralyn Pilar, DO [facility], [place]

## 2022-10-09 NOTE — Progress Notes (Signed)
Subjective:    Patient ID: Andre Robbins, male    DOB: [birth year], 52 y.o.   MRN: [id number]  Andre Robbins is a 52 y.o. male presenting on 10/09/2022 for Annual Exam   HPI  Due [facility] Orders for future  Discussed the use of AI scribe software for clinical note transcription with the patient, who gave verbal consent to proceed.          Lower Urinary Tract Symptoms (LUTS) - IMPROVED He reports improvement in prostate health and symptoms following shoulder surgery, he now has improved urinary flow. He still takes [employer] just to take it. But no further concerns.     Anxiety ADHD Multiple stressors over the past year, including shoulder surgery, also issue with physical pain from shoulder repair impacting his sleep, and anxiety worsening keeping him awake. - Back in [place], saw therapist back in 2016-2017, not a good experience, the therapist fell asleep. Similar issues with stressful situations at that time. - He admits underlying anxiety that has impacted him but he has been able to manage but now he is feeling more overwhelmed. - He is interested in medication - He admits some self doubt - He does mediation to help de-stress    Admits some adjustment stressors w/ anxiety this year - due to work stressors, new boss etc, and overall improving but long process. Tried Melatonin years ago, will consider this again. Aware of sleep hygiene and meditation as well.     Seasonal allergies - on antihistamine AS NEEDED      Past Surgical History:  Procedure Laterality Date   ORIF SHOULDER FRACTURE Left 02/13/2022   Procedure: OPEN REDUCTION INTERNAL FIXATION (ORIF) SHOULDER FRACTURE;  Surgeon: Signa Kell, MD;  Location: [facility] ORS;  Service: Orthopedics;  Laterality: Left;   WISDOM TOOTH EXTRACTION        Health Maintenance:  Due For Flu Shot today   TD TDap 10/2015  COVID vaccine in the future., he had 1st case of covid this year.   History of Shingles flare summer 2023.  Will return for Shingles vaccine. He had R side scalp/head ear area flare before but was very mild and has resolved.   No fam history of prostate cancer. Not due for early screening - see above LUTS, offered PSA.   Paternal grandfather w/ colon cancer age 6 - not due for early screening.      10/09/2022    9:47 AM 12/17/2021    1:16 PM 11/11/2017    3:47 PM  Depression screen PHQ 2/9  Decreased Interest 0 0 0  Down, Depressed, Hopeless 1 1 0  PHQ - 2 Score 1 1 0  Altered sleeping 1 2   Tired, decreased energy 0 1   Change in appetite 0 0   Feeling bad or failure about yourself  0 0   Trouble concentrating 1 0   Moving slowly or fidgety/restless 0 0   Suicidal thoughts 0 0   PHQ-9 Score 3 4   Difficult doing work/chores Somewhat difficult Somewhat difficult       10/09/2022    9:47 AM 12/17/2021    1:16 PM  GAD 7 : Generalized Anxiety Score  Nervous, Anxious, on Edge 2 1  Control/stop worrying 2 2  Worry too much - different things 2 2  Trouble relaxing 2 2  Restless 2 1  Easily annoyed or irritable 2 2  Afraid - awful might happen 0 0  Total GAD 7 Score 12 10  Anxiety Difficulty Somewhat difficult Not difficult at all      Past Medical History:  Diagnosis Date   Allergy    Carpal tunnel syndrome    Herpes    genital   Past Surgical History:  Procedure Laterality Date   ORIF SHOULDER FRACTURE Left 02/13/2022   Procedure: OPEN REDUCTION INTERNAL FIXATION (ORIF) SHOULDER FRACTURE;  Surgeon: Signa Kell, MD;  Location: [facility] OR[facility];  Service: Orthopedics;  Laterality: Left;   WISDOM TOOTH EXTRACTION     Social History   Socioeconomic History   Marital status: Married    Spouse name: Yukie   Number of children: 1   Years of education: PhD (Biology)   Highest education level: Not on file  Occupational History   Occupation: Chief Operating Officer - Production designer, theatre/television/film  Tobacco Use   Smoking status: Former    Current packs/day: 0.00    Average packs/day: 0.5 packs/day for  25.0 years (12.5 ttl pk-yrs)    Types: Cigarettes    Start date: 10/06/1988    Quit date: 10/06/2013    Years since quitting: 9.0   Smokeless tobacco: Never  Vaping Use   Vaping status: Never Used  Substance and Sexual Activity   Alcohol use: Yes    Alcohol/week: 6.0 standard drinks of alcohol    Types: 6 Glasses of wine per week   Drug use: No   Sexual activity: Not on file  Other Topics Concern   Not on file  Social History Narrative   Not on file   Social Determinants of Health   Financial Resource Strain: Not on file  Food Insecurity: Not on file  Transportation Needs: Not on file  Physical Activity: Not on file  Stress: Not on file  Social Connections: Not on file  Intimate Partner Violence: Not on file   Family History  Problem Relation Age of Onset   Kidney nephrosis Mother    Depression Mother    Depression Father    Colon cancer Paternal Grandfather 99   Prostate cancer Neg Hx    Current Outpatient Medications on File Prior to Visit  Medication Sig   cetirizine (ZYRTEC) 10 MG tablet Take 10 mg by mouth daily.   acetaminophen (TYLENOL) 500 MG tablet Take 2 tablets (1,000 mg total) by mouth every 6 (six) hours as needed.   ibuprofen (ADVIL) 100 MG/5ML suspension Take 200 mg by mouth every 4 (four) hours as needed.   saw palmetto 80 MG capsule Take 1-2 capsules (80-160 mg total) by mouth 2 (two) times daily.   No current facility-administered medications on file prior to visit.    Review of Systems  Constitutional:  Negative for activity change, appetite change, chills, diaphoresis, fatigue and fever.  HENT:  Negative for congestion and hearing loss.   Eyes:  Negative for visual disturbance.  Respiratory:  Negative for cough, chest tightness, shortness of breath and wheezing.   Cardiovascular:  Negative for chest pain, palpitations and leg swelling.  Gastrointestinal:  Negative for abdominal pain, constipation, diarrhea, nausea and vomiting.  Genitourinary:   Negative for dysuria, frequency and hematuria.  Musculoskeletal:  Negative for arthralgias and neck pain.  Skin:  Negative for rash.  Neurological:  Negative for dizziness, weakness, light-headedness, numbness and headaches.  Hematological:  Negative for adenopathy.  Psychiatric/Behavioral:  Positive for sleep disturbance. Negative for behavioral problems and dysphoric mood. The patient is nervous/anxious.    Per HPI unless specifically indicated above      Objective:    BP 126/86  Pulse 71   Wt 146 lb (66.2 kg)   SpO2 98%   BMI 19.26 kg/m   Wt Readings from Last 3 Encounters:  10/09/22 146 lb (66.2 kg)  06/03/22 146 lb 6.4 oz (66.4 kg)  02/13/22 149 lb 14.6 oz (68 kg)    Physical Exam Vitals and nursing note reviewed.  Constitutional:      General: He is not in acute distress.    Appearance: He is well-developed. He is not diaphoretic.     Comments: Well-appearing, comfortable, cooperative  HENT:     Head: Normocephalic and atraumatic.  Eyes:     General:        Right eye: No discharge.        Left eye: No discharge.     Conjunctiva/sclera: Conjunctivae normal.     Pupils: Pupils are equal, round, and reactive to light.  Neck:     Thyroid: No thyromegaly.  Cardiovascular:     Rate and Rhythm: Normal rate and regular rhythm.     Pulses: Normal pulses.     Heart sounds: Normal heart sounds. No murmur heard. Pulmonary:     Effort: Pulmonary effort is normal. No respiratory distress.     Breath sounds: Normal breath sounds. No wheezing or rales.  Abdominal:     General: Bowel sounds are normal. There is no distension.     Palpations: Abdomen is soft. There is no mass.     Tenderness: There is no abdominal tenderness.  Musculoskeletal:        General: No tenderness. Normal range of motion.     Cervical back: Normal range of motion and neck supple.     Comments: Upper / Lower Extremities: - Normal muscle tone, strength bilateral upper extremities 5/5, lower  extremities 5/5  Lymphadenopathy:     Cervical: No cervical adenopathy.  Skin:    General: Skin is warm and dry.     Findings: No erythema or rash.  Neurological:     Mental Status: He is alert and oriented to person, place, and time.     Comments: Distal sensation intact to light touch all extremities  Psychiatric:        Mood and Affect: Mood normal.        Behavior: Behavior normal.        Thought Content: Thought content normal.     Comments: Well groomed, good eye contact, normal speech and thoughts       Results for orders placed or performed during the hospital encounter of 02/13/22  Glucose, capillary  Result Value Ref Range   Glucose-Capillary 172 (H) 70 - 99 mg/dL      Assessment & Plan:   Problem List Items Addressed This Visit     BPH with obstruction/lower urinary tract symptoms   Relevant Orders   PSA   Elevated LDL cholesterol level   Relevant Orders   Lipid panel   Genital herpes   Relevant Medications   valACYclovir (VALTREX) 500 MG tablet   Other Visit Diagnoses     GAD (generalized anxiety disorder)    -  Primary   Relevant Medications   escitalopram (LEXAPRO) 10 MG tablet   Other Relevant Orders   CBC with Differential/Platelet   Comprehensive metabolic panel   Need for influenza vaccination       Relevant Orders   Flu vaccine trivalent PF, 6mos and older(Flulaval,Afluria,Fluarix,Fluzone) (Completed)   Screening for prostate cancer       Relevant Orders   PSA  Screening for diabetes mellitus       Relevant Orders   Hemoglobin A1c   Inattention           Future fasting lab orders printed for [facility]  Assessment and Plan    Anxiety Chronic anxiety with periods of increased intensity during stressful situations. Patient has a history of therapy but found it unhelpful. Interested in pursuing therapy again and open to medication management. -Start Lexapro 10mg  daily, reviewed SSRI  -Referral list for CBT therapy given on AVS, he can  contact us or self refer -Schedule follow-up in 3 months to reassess anxiety.  Sleep Disturbance Difficulty sleeping due to anxiety and stress. -Encourage continued use of meditation app. -Consider impact of Lexapro on sleep patterns at follow-up.  Prostate Health No current urinary symptoms. Infrequent use of Saw Palmetto. -Continue monitoring.  General Health Maintenance -Administer flu shot today. -Discussed COVID booster and shingles shot, to be scheduled. -Order future physical labs for 12/2022 including PSA, thyroid, A1C, and cholesterol. -Renew Valtrex prescription.  Follow-up in 2 months for a general check-up.   Orders Placed This Encounter  Procedures   Flu vaccine trivalent PF, 6mos and older(Flulaval,Afluria,Fluarix,Fluzone)   Hemoglobin A1c   CBC with Differential/Platelet   Lipid panel    Order Specific Question:   Has the patient fasted?    Answer:   Yes   PSA   Comprehensive metabolic panel    Order Specific Question:   Has the patient fasted?    Answer:   Yes      Meds ordered this encounter  Medications   valACYclovir (VALTREX) 500 MG tablet    Sig: Take 1 tablet (500 mg total) by mouth 2 (two) times daily as needed. For 3 days per HSV flare as needed.    Dispense:  30 tablet    Refill:  0   escitalopram (LEXAPRO) 10 MG tablet    Sig: Take 1 tablet (10 mg total) by mouth daily.    Dispense:  90 tablet    Refill:  1      Follow up plan: Return in about 3 months (around 01/09/2023) for 3 month Anxiety follow-up.  Saralyn Pilar, DO [facility] 10/09/2022, 9:41 AM

## 2022-10-10 ENCOUNTER — Encounter: Payer: Self-pay | Admitting: Family Medicine

## 2022-10-10 DIAGNOSIS — F5104 Psychophysiologic insomnia: Secondary | ICD-10-CM

## 2022-10-21 ENCOUNTER — Ambulatory Visit: Payer: Self-pay | Admitting: *Deleted

## 2022-10-21 MED ORDER — TRAZODONE HCL 50 MG PO TABS: 25.0000 mg | ORAL_TABLET | Freq: Every evening | ORAL | 0 refills | Status: DC | PRN

## 2022-10-21 NOTE — Telephone Encounter (Signed)
Chief Complaint: severe anxiety since taking medication lexapro. Has not taken today  please advise if patient needs to take today  Symptoms: severe mood swings of crying and feeling like "a total break down". Calm but anxious at this time. Wife with patient . Not slept in 2 days. Mood goes up and down. Has talked with "MD live" today. Reports overwhelming moments esp at work, "whole like is in reflux". Anxiety level 7-8 at this time. Patient feels medication lexapro causes worsening sx  Frequency: worsening sx since Sunday  Pertinent Negatives: Patient denies chest pain no difficulty breathing at this time. Denies hurting self or others Disposition: [] ED /[] Urgent Care (no appt availability in office) / [x] Appointment(In office/virtual)/ []  [place] [facility]/ [] Home Care/ [] Refused Recommended Disposition /[] [place] [facility]/ []  Follow-up with PCP Additional Notes:   Recommended ED or to call 911 if sx worsen today. Patient requesting a call back asap is he should take medication lexapro today. Requesting a call back.       Reason for Disposition  Patient sounds very upset or troubled to the triager  Answer Assessment - Initial Assessment Questions 1. CONCERN: "Did anything happen that prompted you to call today?"      Whole life is in reflux  2. ANXIETY SYMPTOMS: "Can you describe how you (your loved one; patient) have been feeling?" (e.g., tense, restless, panicky, anxious, keyed up, overwhelmed, sense of impending doom).      Anxious , starts crying for no reason  3. ONSET: "How long have you been feeling this way?" (e.g., hours, days, weeks)     Since Sunday  4. SEVERITY: "How would you rate the level of anxiety?" (e.g., 0 - 10; or mild, moderate, severe).     7-8 now  5. FUNCTIONAL IMPAIRMENT: "How have these feelings affected your ability to do daily activities?" "Have you had more difficulty than usual doing your normal daily activities?" (e.g., getting better, same,  worse; self-care, school, work, interactions)     Not able to sleep x 2 days more stressed situations at work  6. HISTORY: "Have you felt this way before?" "Have you ever been diagnosed with an anxiety problem in the past?" (e.g., generalized anxiety disorder, panic attacks, PTSD). If Yes, ask: "How was this problem treated?" (e.g., medicines, counseling, etc.)     Yes  7. RISK OF HARM - SUICIDAL IDEATION: "Do you ever have thoughts of hurting or killing yourself?" If Yes, ask:  "Do you have these feelings now?" "Do you have a plan on how you would do this?"     Denies  8. TREATMENT:  "What has been done so far to treat this anxiety?" (e.g., medicines, relaxation strategies). "What has helped?"     Has not taken medications this am due to feeling worse 9. TREATMENT - THERAPIST: "Do you have a counselor or therapist? Name?"     Yes talked with MD live counselor this am  10. POTENTIAL TRIGGERS: "Do you drink caffeinated beverages (e.g., coffee, colas, teas), and how much daily?" "Do you drink alcohol or use any drugs?" "Have you started any new medicines recently?"       Drinks 1 beer at night on occasion 11. PATIENT SUPPORT: "Who is with you now?" "Who do you live with?" "Do you have family or friends who you can talk to?"        With wife  29. OTHER SYMPTOMS: "Do you have any other symptoms?" (e.g., feeling depressed, trouble concentrating, trouble sleeping, trouble  breathing, palpitations or fast heartbeat, chest pain, sweating, nausea, or diarrhea)       Trouble sleeping , massive overwhelmed feelings , crying for no reason 13. PREGNANCY: "Is there any chance you are pregnant?" "When was your last menstrual period?"       na  Protocols used: Anxiety and Panic Attack-A-AH

## 2022-10-21 NOTE — Addendum Note (Signed)
Addended by: Smitty Cords on: 10/21/2022 11:41 AM   Modules accepted: Orders

## 2022-10-21 NOTE — Telephone Encounter (Signed)
This encounter has been handled in separate MyChart Encounter with direct communications with patient in that encounter.

## 2022-10-22 ENCOUNTER — Encounter: Payer: Self-pay | Admitting: Family Medicine

## 2022-10-22 ENCOUNTER — Telehealth: Payer: BC Managed Care – PPO | Admitting: Family Medicine

## 2022-10-22 DIAGNOSIS — F5104 Psychophysiologic insomnia: Secondary | ICD-10-CM

## 2022-10-22 DIAGNOSIS — R41840 Attention and concentration deficit: Secondary | ICD-10-CM | POA: Diagnosis not present

## 2022-10-22 DIAGNOSIS — F411 Generalized anxiety disorder: Secondary | ICD-10-CM

## 2022-10-22 NOTE — Patient Instructions (Addendum)
Thank you for coming to the office today.  Take Trazodone as needed for sleep, half or whole tab.  Remain OFF Lexapro  Consider [facility] option through [facility].  Let me know when you identify therapist of choice we can refer or your can self refer  Reconsider Buspar or other therapy in future.  Please schedule a Follow-up Appointment to: Return if symptoms worsen or fail to improve.  If you have any other questions or concerns, please feel free to call the office or send a message through MyChart. You may also schedule an earlier appointment if necessary.  Additionally, you may be receiving a survey about your experience at our office within a few days to 1 week by e-mail or mail. We value your feedback.  Saralyn Pilar, DO [facility], [place]

## 2022-10-22 NOTE — Progress Notes (Signed)
Subjective:    Patient ID: Andre Robbins, male    DOB: 12-28-1970, 52 y.o.   MRN: [id number]  Andre Robbins is a 52 y.o. male presenting on 10/22/2022 for Anxiety  Virtual / Telehealth Encounter - Video Visit via MyChart The purpose of this virtual visit is to provide medical care while limiting exposure to the novel coronavirus (COVID19) for both patient and office staff.  Consent was obtained for remote visit:  Yes.   Answered questions that patient had about telehealth interaction:  Yes.   I discussed the limitations, risks, security and privacy concerns of performing an evaluation and management service by video/telephone. I also discussed with the patient that there may be a patient responsible charge related to this service. The patient expressed understanding and agreed to proceed.  Patient Location: Home Provider Location: [facility] (Office)  Participants in virtual visit: - Patient: Fines Suhre - CMA: Shirley Muscat CMA - Provider: Dr Althea Charon   HPI  Discussed the use of AI scribe software for clinical note transcription with the patient, who gave verbal consent to proceed.     Anxiety Insomnia  Last visit with me on 10/09/22 for anxiety (see prior note for background info and history), he was started Escitalopram Lexapro SSRI 10mg  daily, he said  when he started it he had increased anxiety symptoms and jittery, he had difficulty with sleeping for several days. Describes adverse effects on medication, tried med for several days without relief. He discontinued medication now. His last dose of Escitalopram  was yesterday 10/21/22. Today he already feels improved off of medication.  He has contacted [place] on MyChart this week and we discussed his symptoms, he was given Trazodone 50mg  tab, he started dose half pill 25mg  last night and he did sleep fairly well.  The patient expressed interest in genetic testing to better understand his metabolism of medications,  with the goal of tailoring future treatment plans more effectively.  Previously given therapist info on referrals for cognitive behavioral therapy. He will look into options and follow up about a referral      10/09/2022    9:47 AM 12/17/2021    1:16 PM 11/11/2017    3:47 PM  Depression screen PHQ 2/9  Decreased Interest 0 0 0  Down, Depressed, Hopeless 1 1 0  PHQ - 2 Score 1 1 0  Altered sleeping 1 2   Tired, decreased energy 0 1   Change in appetite 0 0   Feeling bad or failure about yourself  0 0   Trouble concentrating 1 0   Moving slowly or fidgety/restless 0 0   Suicidal thoughts 0 0   PHQ-9 Score 3 4   Difficult doing work/chores Somewhat difficult Somewhat difficult     Social History   Tobacco Use   Smoking status: Former    Current packs/day: 0.00    Average packs/day: 0.5 packs/day for 25.0 years (12.5 ttl pk-yrs)    Types: Cigarettes    Start date: 10/06/1988    Quit date: 10/06/2013    Years since quitting: 9.0   Smokeless tobacco: Never  Vaping Use   Vaping status: Never Used  Substance Use Topics   Alcohol use: Yes    Alcohol/week: 6.0 standard drinks of alcohol    Types: 6 Glasses of wine per week   Drug use: No    Review of Systems Per HPI unless specifically indicated above     Objective:    There were no vitals  taken for this visit.  Wt Readings from Last 3 Encounters:  10/09/22 146 lb (66.2 kg)  06/03/22 146 lb 6.4 oz (66.4 kg)  02/13/22 149 lb 14.6 oz (68 kg)    Physical Exam  Note examination was completely remotely via video observation objective data only  Gen - well-appearing, no acute distress or apparent pain, comfortable HEENT - eyes appear clear without discharge or redness Heart/Lungs - cannot examine virtually - observed no evidence of coughing or labored breathing. Abd - cannot examine virtually  Skin - face visible today- no rash Neuro - awake, alert, oriented Psych - not anxious appearing   Results for orders placed  or performed during the hospital encounter of 02/13/22  Glucose, capillary  Result Value Ref Range   Glucose-Capillary 172 (H) 70 - 99 mg/dL      Assessment & Plan:   Problem List Items Addressed This Visit     GAD (generalized anxiety disorder) - Primary   Other Visit Diagnoses     Psychophysiological insomnia       Inattention           Assessment and Plan    Anxiety Insomnia Suspected ADD vs ADHD  Failed Lexapro 10mg  due to side effects worsening anxiety jittery insomnia - DC'd med 10/21/22  -Continue Trazodone 25-50mg  at night as needed as tolerated, initial dose tolerated well. -Consider genetic testing through [facility] for pharmacogenomics to guide future medication choices. -Consider Buspar as a potential future medication option for AS NEEDED anxiety  Review AVS list previously with therapist / mental health options, he can self refer or contact me for referral. I believe he will benefit from CBT in addition to medical management or consider eval for ADHD and therapy for this if indicated        No orders of the defined types were placed in this encounter.    Follow up plan: Return if symptoms worsen or fail to improve.   Patient verbalizes understanding with the above medical recommendations including the limitation of remote medical advice.  Specific follow-up and call-back criteria were given for patient to follow-up or seek medical care more urgently if needed.  Total duration of direct patient care provided via video conference: 15 minutes    Saralyn Pilar, DO [facility] 10/22/2022, 11:36 AM

## 2022-11-27 ENCOUNTER — Other Ambulatory Visit: Payer: Self-pay | Admitting: Family Medicine

## 2022-11-27 DIAGNOSIS — F5104 Psychophysiologic insomnia: Secondary | ICD-10-CM

## 2022-11-28 ENCOUNTER — Other Ambulatory Visit: Payer: Self-pay | Admitting: Family Medicine

## 2022-11-28 DIAGNOSIS — F5104 Psychophysiologic insomnia: Secondary | ICD-10-CM

## 2022-11-28 MED ORDER — TRAZODONE HCL 50 MG PO TABS: 25.0000 mg | ORAL_TABLET | Freq: Every evening | ORAL | 0 refills | Status: DC | PRN

## 2022-11-28 NOTE — Telephone Encounter (Signed)
Duplicate request- filled by office today Requested Prescriptions  Pending Prescriptions Disp Refills   traZODone (DESYREL) 50 MG tablet [Pharmacy Med Name: TRAZODONE 50MG  TABLETS] 30 tablet 0    Sig: TAKE 1/2 TO 1 TABLET(25 TO 50 MG) BY MOUTH AT BEDTIME AS NEEDED FOR SLEEP     Psychiatry: Antidepressants - Serotonin Modulator Passed - 11/27/2022  9:54 AM      Passed - Valid encounter within last 6 months    Recent Outpatient Visits           1 month ago GAD (generalized anxiety disorder)   [place] [facility] Cogswell, Netta Neat, DO   1 month ago GAD (generalized anxiety disorder)   [place] [facility] Smitty Cords, DO   11 months ago Annual physical exam   [place] [facility] Smitty Cords, DO   5 years ago Annual physical exam   [place] [facility] Smitty Cords, DO   5 years ago Puncture wound of left foot, initial encounter   [place] [facility], Netta Neat, DO       Future Appointments             In 3 weeks Althea Charon, Netta Neat, DO [place] [facility], [facility]

## 2022-12-17 ENCOUNTER — Encounter: Payer: Self-pay | Admitting: Family Medicine

## 2022-12-19 ENCOUNTER — Encounter: Payer: Self-pay | Admitting: Family Medicine

## 2022-12-19 ENCOUNTER — Ambulatory Visit (INDEPENDENT_AMBULATORY_CARE_PROVIDER_SITE_OTHER): Payer: BC Managed Care – PPO | Admitting: Family Medicine

## 2022-12-19 VITALS — BP 132/78 | HR 64 | Ht 73.0 in | Wt 151.0 lb

## 2022-12-19 DIAGNOSIS — N401 Enlarged prostate with lower urinary tract symptoms: Secondary | ICD-10-CM | POA: Diagnosis not present

## 2022-12-19 DIAGNOSIS — Z0000 Encounter for general adult medical examination without abnormal findings: Secondary | ICD-10-CM

## 2022-12-19 DIAGNOSIS — F411 Generalized anxiety disorder: Secondary | ICD-10-CM

## 2022-12-19 DIAGNOSIS — E7800 Pure hypercholesterolemia, unspecified: Secondary | ICD-10-CM

## 2022-12-19 DIAGNOSIS — Z131 Encounter for screening for diabetes mellitus: Secondary | ICD-10-CM

## 2022-12-19 DIAGNOSIS — N138 Other obstructive and reflux uropathy: Secondary | ICD-10-CM

## 2022-12-19 DIAGNOSIS — Z125 Encounter for screening for malignant neoplasm of prostate: Secondary | ICD-10-CM

## 2022-12-19 DIAGNOSIS — F5104 Psychophysiologic insomnia: Secondary | ICD-10-CM

## 2022-12-19 MED ORDER — TRAZODONE HCL 50 MG PO TABS: 50.0000 mg | ORAL_TABLET | Freq: Every evening | ORAL | 1 refills | Status: AC | PRN

## 2022-12-19 NOTE — Progress Notes (Signed)
Subjective:    Patient ID: Andre Robbins, male    DOB: September 11, 1970, 52 y.o.   MRN: [id number]  Andre Robbins is a 52 y.o. male presenting on 12/19/2022 for Annual Exam   HPI  Discussed the use of AI scribe software for clinical note transcription with the patient, who gave verbal consent to proceed.      Lifestyle BMI 19 He eats balanced healthy diet The patient also discussed his recent lab results, which indicated a borderline prediabetic A1c level of 5.6. He reported a diet high in carbohydrates and sugars, and acknowledged the need for dietary modifications to manage this risk. Despite the borderline A1c, the patient's cholesterol levels remained steady, with a slight improvement in LDL levels.  Fam history of Alzheimer's, mother age 80+ He is APO-E4 - positive reported. Concern about his risk.  Anxiety ADHD Insomnia Reviews his mood, sleep, and anxiety, which he reported as being under control with intermittent use of Trazodone 50mg . He noted that the medication was effective in promoting better sleep and reducing anxiety, particularly during periods of high stress. Not on SSRI Lexapro due to side effects Previously given therapist info on referrals for cognitive behavioral therapy. He will look into options and follow up about a referral   Lower Urinary Tract Symptoms (LUTS), mild weak urinary stream Improved on Saw Palmetto supplement daily   Health Maintenance: UTD Flu and COVID booster   TD TDap 10/2015   History of Shingles flare summer 2023. Will return for Shingles vaccine. He had R side scalp/head ear area flare before but was very mild and has resolved.   Ordered PSA       12/19/2022    9:02 AM 10/09/2022    9:47 AM 12/17/2021    1:16 PM  Depression screen PHQ 2/9  Decreased Interest 0 0 0  Down, Depressed, Hopeless 1 1 1   PHQ - 2 Score 1 1 1   Altered sleeping 1 1 2   Tired, decreased energy 1 0 1  Change in appetite 0 0 0  Feeling bad or failure about  yourself  1 0 0  Trouble concentrating 0 1 0  Moving slowly or fidgety/restless 0 0 0  Suicidal thoughts 0 0 0  PHQ-9 Score 4 3 4   Difficult doing work/chores  Somewhat difficult Somewhat difficult       12/19/2022    9:02 AM 10/09/2022    9:47 AM 12/17/2021    1:16 PM  GAD 7 : Generalized Anxiety Score  Nervous, Anxious, on Edge 2 2 1   Control/stop worrying 2 2 2   Worry too much - different things 2 2 2   Trouble relaxing 2 2 2   Restless 1 2 1   Easily annoyed or irritable 2 2 2   Afraid - awful might happen 0 0 0  Total GAD 7 Score 11 12 10   Anxiety Difficulty  Somewhat difficult Not difficult at all     Past Medical History:  Diagnosis Date   Allergy    Carpal tunnel syndrome    Herpes    genital   Past Surgical History:  Procedure Laterality Date   ORIF SHOULDER FRACTURE Left 02/13/2022   Procedure: OPEN REDUCTION INTERNAL FIXATION (ORIF) SHOULDER FRACTURE;  Surgeon: Signa Kell, MD;  Location: [facility] ORS;  Service: Orthopedics;  Laterality: Left;   WISDOM TOOTH EXTRACTION     Social History   Socioeconomic History   Marital status: Married    Spouse name: Mayme Genta   Number of children: 1  Years of education: PhD (Biology)   Highest education level: Not on file  Occupational History   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, theatre/television/film  Tobacco Use   Smoking status: Former    Current packs/day: 0.00    Average packs/day: 0.5 packs/day for 25.0 years (12.5 ttl pk-yrs)    Types: Cigarettes    Start date: 10/06/1988    Quit date: 10/06/2013    Years since quitting: 9.2   Smokeless tobacco: Never  Vaping Use   Vaping status: Never Used  Substance and Sexual Activity   Alcohol use: Yes    Alcohol/week: 6.0 standard drinks of alcohol    Types: 6 Glasses of wine per week   Drug use: No   Sexual activity: Not on file  Other Topics Concern   Not on file  Social History Narrative   Not on file   Social Drivers of Health   Financial Resource Strain: Not on file  Food  Insecurity: Not on file  Transportation Needs: Not on file  Physical Activity: Not on file  Stress: Not on file  Social Connections: Not on file  Intimate Partner Violence: Not on file   Family History  Problem Relation Age of Onset   Kidney nephrosis Mother    Depression Mother    Alzheimer's disease Mother 57   ADD / ADHD Mother    Depression Father    Atrial fibrillation Father    Anxiety disorder Sister    Colon cancer Paternal Grandfather 76   Prostate cancer Neg Hx    Current Outpatient Medications on File Prior to Visit  Medication Sig   cetirizine (ZYRTEC) 10 MG tablet Take 10 mg by mouth daily.   saw palmetto 80 MG capsule Take 1-2 capsules (80-160 mg total) by mouth 2 (two) times daily.   valACYclovir (VALTREX) 500 MG tablet Take 1 tablet (500 mg total) by mouth 2 (two) times daily as needed. For 3 days per HSV flare as needed.   acetaminophen (TYLENOL) 500 MG tablet Take 2 tablets (1,000 mg total) by mouth every 6 (six) hours as needed.   ibuprofen (ADVIL) 100 MG/5ML suspension Take 200 mg by mouth every 4 (four) hours as needed.   No current facility-administered medications on file prior to visit.    Review of Systems  Constitutional:  Negative for activity change, appetite change, chills, diaphoresis, fatigue and fever.  HENT:  Negative for congestion and hearing loss.   Eyes:  Negative for visual disturbance.  Respiratory:  Negative for cough, chest tightness, shortness of breath and wheezing.   Cardiovascular:  Negative for chest pain, palpitations and leg swelling.  Gastrointestinal:  Negative for abdominal pain, constipation, diarrhea, nausea and vomiting.  Genitourinary:  Negative for dysuria, frequency and hematuria.  Musculoskeletal:  Negative for arthralgias and neck pain.  Skin:  Negative for rash.  Neurological:  Negative for dizziness, weakness, light-headedness, numbness and headaches.  Hematological:  Negative for adenopathy.   Psychiatric/Behavioral:  Negative for behavioral problems, dysphoric mood and sleep disturbance.    Per HPI unless specifically indicated above     Objective:    BP 132/78   Pulse 64   Ht 6\' 1"  (1.854 m)   Wt 151 lb (68.5 kg)   BMI 19.92 kg/m   Wt Readings from Last 3 Encounters:  12/19/22 151 lb (68.5 kg)  10/09/22 146 lb (66.2 kg)  06/03/22 146 lb 6.4 oz (66.4 kg)    Physical Exam Vitals and nursing note reviewed.  Constitutional:  General: He is not in acute distress.    Appearance: He is well-developed. He is not diaphoretic.     Comments: Well-appearing, comfortable, cooperative  HENT:     Head: Normocephalic and atraumatic.  Eyes:     General:        Right eye: No discharge.        Left eye: No discharge.     Conjunctiva/sclera: Conjunctivae normal.     Pupils: Pupils are equal, round, and reactive to light.  Neck:     Thyroid: No thyromegaly.     Vascular: No carotid bruit.  Cardiovascular:     Rate and Rhythm: Normal rate and regular rhythm.     Pulses: Normal pulses.     Heart sounds: Normal heart sounds. No murmur heard. Pulmonary:     Effort: Pulmonary effort is normal. No respiratory distress.     Breath sounds: Normal breath sounds. No wheezing or rales.  Abdominal:     General: Bowel sounds are normal. There is no distension.     Palpations: Abdomen is soft. There is no mass.     Tenderness: There is no abdominal tenderness.  Musculoskeletal:        General: No tenderness. Normal range of motion.     Cervical back: Normal range of motion and neck supple.     Right lower leg: No edema.     Left lower leg: No edema.     Comments: Upper / Lower Extremities: - Normal muscle tone, strength bilateral upper extremities 5/5, lower extremities 5/5  Lymphadenopathy:     Cervical: No cervical adenopathy.  Skin:    General: Skin is warm and dry.     Findings: No erythema or rash.  Neurological:     Mental Status: He is alert and oriented to person,  place, and time.     Comments: Distal sensation intact to light touch all extremities  Psychiatric:        Mood and Affect: Mood normal.        Behavior: Behavior normal.        Thought Content: Thought content normal.     Comments: Well groomed, good eye contact, normal speech and thoughts     Results for orders placed or performed in visit on 12/19/22  Basic metabolic panel   Collection Time: 07/17/22 12:00 AM  Result Value Ref Range   Glucose 89    Creatinine 1.2 0.6 - 1.3  Comprehensive metabolic panel   Collection Time: 07/17/22 12:00 AM  Result Value Ref Range   eGFR 72   Lipid panel   Collection Time: 07/17/22 12:00 AM  Result Value Ref Range   Triglycerides 75 40 - 160   Cholesterol 210 (A) 0 - 200   HDL 78 (A) 35 - 70   LDL Cholesterol 119   Hemoglobin A1c   Collection Time: 07/17/22 12:00 AM  Result Value Ref Range   Hemoglobin A1C 5.6       Assessment & Plan:   Problem List Items Addressed This Visit     BPH with obstruction/lower urinary tract symptoms   Relevant Orders   PSA   Elevated LDL cholesterol level   Relevant Orders   Lipid panel   TSH   GAD (generalized anxiety disorder)   Relevant Medications   traZODone (DESYREL) 50 MG tablet   Other Relevant Orders   CBC with Differential/Platelet   Comprehensive metabolic panel   Other Visit Diagnoses       Annual  physical exam    -  Primary   Relevant Orders   Lipid panel   Hemoglobin A1c   CBC with Differential/Platelet   Comprehensive metabolic panel     Psychophysiological insomnia       Relevant Medications   traZODone (DESYREL) 50 MG tablet     Screening for prostate cancer       Relevant Orders   PSA     Screening for diabetes mellitus       Relevant Orders   Hemoglobin A1c        Updated Health Maintenance information Reviewed recent lab results with patient Encouraged improvement to lifestyle with diet and exercise Goal of weight loss   Hyperlipidemia LDL 119, total  cholesterol 210, HDL 78, triglycerides 75. Stable compared to previous year. 10-year cardiovascular risk calculated at 3.5%, not high enough to warrant medication. -Continue current lifestyle modifications. -Consider Coronary Calcium Score CT  Impaired Glucose Tolerance A1c 5.6, stable over the past 6 years, just below the threshold for prediabetes. Patient acknowledges high intake of carbohydrates. -Encourage reduction in carbohydrate intake and continuation of physical activity.  Insomnia Intermittent use of Trazodone 50mg  providing benefit. -Continue Trazodone 50mg  as needed for sleep. Refill prescription.  General Health Maintenance -Order complete metabolic panel, blood count, and PSA for prostate screening. -Reminder for shingles vaccine. -Continue monitoring for changes in moles and seborrheic keratoses.      Orders Placed This Encounter  Procedures   Basic metabolic panel    This external order was created through the Results Console.   Comprehensive metabolic panel    This external order was created through the Results Console.   Lipid panel    This external order was created through the Results Console.   Hemoglobin A1c    This external order was created through the Results Console.   Lipid panel    Has the patient fasted?:   Yes   Hemoglobin A1c   CBC with Differential/Platelet   PSA   Comprehensive metabolic panel    Has the patient fasted?:   Yes   TSH    Meds ordered this encounter  Medications   traZODone (DESYREL) 50 MG tablet    Sig: Take 1 tablet (50 mg total) by mouth at bedtime as needed for sleep.    Dispense:  90 tablet    Refill:  1     Follow up plan: Return in about 1 year (around 12/19/2023) for 1 year Annual Physical .  Saralyn Pilar, DO [facility] 12/19/2022, 8:22 AM

## 2022-12-19 NOTE — Patient Instructions (Addendum)
Thank you for coming to the office today.  [facility] orders today  Refilled Trazodone for 50mg , 90 pills +1 refill  Consider this option: Coronary Calcium Score Cardiac CT Scan. This is a screening test for patients aged 53-50+ with cardiovascular risk factors or who are healthy but would be interested in Cardiovascular Screening for heart disease. Even if there is a family history of heart disease, this imaging can be useful. Typically it can be done every 5+ years or at a different timeline we agree on  The scan will look at the chest and mainly focus on the heart and identify early signs of calcium build up or blockages within the heart arteries. It is not 100% accurate for identifying blockages or heart disease, but it is useful to help [place] predict who may have some early changes or be at risk in the future for a heart attack or cardiovascular problem.  The results are reviewed by a Cardiologist and they will document the results. It should become available on MyChart. Typically the results are divided into percentiles based on other patients of the same demographic and age. So it will compare your risk to others similar to you. If you have a higher score >99 or higher percentile >75%tile, it is recommended to consider Statin cholesterol therapy and or referral to Cardiologist. I will try to help explain your results and if we have questions we can contact the Cardiologist.  You will be contacted for scheduling. Usually it is done at any imaging facility through [facility], [facility] or [facility].  The cost is $99 flat fee total and it does not go through insurance, so no authorization is required.    Please schedule a Follow-up Appointment to: Return in about 1 year (around 12/19/2023) for 1 year Annual Physical .  If you have any other questions or concerns, please feel free to call the office or send a message through MyChart. You may also schedule an  earlier appointment if necessary.  Additionally, you may be receiving a survey about your experience at our office within a few days to 1 week by e-mail or mail. We value your feedback.  Saralyn Pilar, DO [facility], [place]

## 2022-12-30 DIAGNOSIS — N401 Enlarged prostate with lower urinary tract symptoms: Secondary | ICD-10-CM | POA: Diagnosis not present

## 2022-12-30 DIAGNOSIS — Z131 Encounter for screening for diabetes mellitus: Secondary | ICD-10-CM | POA: Diagnosis not present

## 2022-12-30 DIAGNOSIS — F411 Generalized anxiety disorder: Secondary | ICD-10-CM | POA: Diagnosis not present

## 2022-12-30 DIAGNOSIS — E7800 Pure hypercholesterolemia, unspecified: Secondary | ICD-10-CM | POA: Diagnosis not present

## 2022-12-30 DIAGNOSIS — Z125 Encounter for screening for malignant neoplasm of prostate: Secondary | ICD-10-CM | POA: Diagnosis not present

## 2022-12-30 DIAGNOSIS — Z0000 Encounter for general adult medical examination without abnormal findings: Secondary | ICD-10-CM | POA: Diagnosis not present

## 2022-12-31 LAB — CBC WITH DIFFERENTIAL/PLATELET
Basophils Absolute: 0 10*3/uL (ref 0.0–0.2)
Basos: 1 %
EOS (ABSOLUTE): 0 10*3/uL (ref 0.0–0.4)
Eos: 1 %
Hematocrit: 47.9 % (ref 37.5–51.0)
Hemoglobin: 16.4 g/dL (ref 13.0–17.7)
Immature Grans (Abs): 0 10*3/uL (ref 0.0–0.1)
Immature Granulocytes: 0 %
Lymphocytes Absolute: 2.1 10*3/uL (ref 0.7–3.1)
Lymphs: 38 %
MCH: 31.7 pg (ref 26.6–33.0)
MCHC: 34.2 g/dL (ref 31.5–35.7)
MCV: 93 fL (ref 79–97)
Monocytes Absolute: 0.4 10*3/uL (ref 0.1–0.9)
Monocytes: 8 %
Neutrophils Absolute: 2.8 10*3/uL (ref 1.4–7.0)
Neutrophils: 52 %
Platelets: 243 10*3/uL (ref 150–450)
RBC: 5.17 x10E6/uL (ref 4.14–5.80)
RDW: 12 % (ref 11.6–15.4)
WBC: 5.3 10*3/uL (ref 3.4–10.8)

## 2022-12-31 LAB — COMPREHENSIVE METABOLIC PANEL
ALT: 15 [IU]/L (ref 0–44)
AST: 18 [IU]/L (ref 0–40)
Albumin: 4.5 g/dL (ref 3.8–4.9)
Alkaline Phosphatase: 83 [IU]/L (ref 44–121)
BUN/Creatinine Ratio: 15 (ref 9–20)
BUN: 15 mg/dL (ref 6–24)
Bilirubin Total: 0.8 mg/dL (ref 0.0–1.2)
CO2: 25 mmol/L (ref 20–29)
Calcium: 9.8 mg/dL (ref 8.7–10.2)
Chloride: 103 mmol/L (ref 96–106)
Creatinine, Ser: 1.03 mg/dL (ref 0.76–1.27)
Globulin, Total: 1.7 g/dL (ref 1.5–4.5)
Glucose: 106 mg/dL — ABNORMAL HIGH (ref 70–99)
Potassium: 4.5 mmol/L (ref 3.5–5.2)
Sodium: 140 mmol/L (ref 134–144)
Total Protein: 6.2 g/dL (ref 6.0–8.5)
eGFR: 87 mL/min/{1.73_m2} (ref >59)

## 2022-12-31 LAB — HEMOGLOBIN A1C
Est. average glucose Bld gHb Est-mCnc: 111 mg/dL
Hgb A1c MFr Bld: 5.5 % (ref 4.8–5.6)

## 2022-12-31 LAB — LIPID PANEL
Chol/HDL Ratio: 2.8 {ratio} (ref 0.0–5.0)
Cholesterol, Total: 225 mg/dL — ABNORMAL HIGH (ref 100–199)
HDL: 81 mg/dL (ref >39)
LDL Chol Calc (NIH): 131 mg/dL — ABNORMAL HIGH (ref 0–99)
Triglycerides: 75 mg/dL (ref 0–149)
VLDL Cholesterol Cal: 13 mg/dL (ref 5–40)

## 2022-12-31 LAB — TSH: TSH: 2.03 u[IU]/mL (ref 0.450–4.500)

## 2022-12-31 LAB — PSA: Prostate Specific Ag, Serum: 0.8 ng/mL (ref 0.0–4.0)

## 2023-12-17 ENCOUNTER — Encounter: Payer: Self-pay | Admitting: Family Medicine
